# Patient Record
Sex: Female | Born: 1950 | Race: White | Hispanic: No | Marital: Married | Smoking: Never smoker
Health system: Southern US, Community
[De-identification: ages and names within clinical notes are randomized; demographics above are authoritative.]

## PROBLEM LIST (undated history)

## (undated) DIAGNOSIS — M722 Plantar fascial fibromatosis: Secondary | ICD-10-CM

## (undated) DIAGNOSIS — I10 Essential (primary) hypertension: Secondary | ICD-10-CM

## (undated) HISTORY — PX: KNEE ARTHROSCOPY: SUR90

## (undated) HISTORY — PX: TONSILLECTOMY: SUR1361

## (undated) HISTORY — PX: APPENDECTOMY: SHX54

---

## 2003-12-18 ENCOUNTER — Ambulatory Visit: Payer: Self-pay | Admitting: Gastroenterology

## 2004-12-06 ENCOUNTER — Ambulatory Visit: Payer: Self-pay | Admitting: Obstetrics and Gynecology

## 2005-12-12 ENCOUNTER — Ambulatory Visit: Payer: Self-pay | Admitting: Family Medicine

## 2006-10-30 ENCOUNTER — Ambulatory Visit: Payer: Self-pay | Admitting: Orthopaedic Surgery

## 2006-12-19 ENCOUNTER — Ambulatory Visit: Payer: Self-pay | Admitting: Anesthesiology

## 2007-01-01 ENCOUNTER — Ambulatory Visit: Payer: Self-pay | Admitting: Anesthesiology

## 2007-02-04 ENCOUNTER — Ambulatory Visit: Payer: Self-pay | Admitting: Anesthesiology

## 2007-02-05 ENCOUNTER — Ambulatory Visit: Payer: Self-pay | Admitting: Family Medicine

## 2007-03-25 ENCOUNTER — Ambulatory Visit: Payer: Self-pay | Admitting: Anesthesiology

## 2007-05-06 ENCOUNTER — Ambulatory Visit: Payer: Self-pay | Admitting: Anesthesiology

## 2007-05-18 ENCOUNTER — Emergency Department: Payer: Self-pay | Admitting: Emergency Medicine

## 2007-06-04 ENCOUNTER — Ambulatory Visit: Payer: Self-pay | Admitting: Anesthesiology

## 2007-07-08 ENCOUNTER — Ambulatory Visit: Payer: Self-pay | Admitting: Anesthesiology

## 2007-07-24 ENCOUNTER — Ambulatory Visit: Payer: Self-pay | Admitting: Anesthesiology

## 2007-08-23 ENCOUNTER — Ambulatory Visit: Payer: Self-pay | Admitting: Anesthesiology

## 2007-09-19 ENCOUNTER — Ambulatory Visit: Payer: Self-pay | Admitting: Anesthesiology

## 2007-10-02 ENCOUNTER — Encounter: Payer: Self-pay | Admitting: Anesthesiology

## 2007-10-12 ENCOUNTER — Encounter: Payer: Self-pay | Admitting: Anesthesiology

## 2007-10-14 ENCOUNTER — Ambulatory Visit: Payer: Self-pay | Admitting: Anesthesiology

## 2007-11-12 ENCOUNTER — Encounter: Payer: Self-pay | Admitting: Anesthesiology

## 2007-12-19 ENCOUNTER — Ambulatory Visit: Payer: Self-pay | Admitting: Anesthesiology

## 2008-02-20 ENCOUNTER — Ambulatory Visit: Payer: Self-pay | Admitting: Family Medicine

## 2008-02-20 ENCOUNTER — Ambulatory Visit: Payer: Self-pay | Admitting: Orthopedic Surgery

## 2008-03-09 ENCOUNTER — Ambulatory Visit: Payer: Self-pay | Admitting: Orthopedic Surgery

## 2008-03-10 ENCOUNTER — Ambulatory Visit: Payer: Self-pay | Admitting: Orthopedic Surgery

## 2009-06-17 ENCOUNTER — Ambulatory Visit: Payer: Self-pay | Admitting: Family Medicine

## 2010-07-15 ENCOUNTER — Ambulatory Visit: Payer: Self-pay | Admitting: Family Medicine

## 2011-07-18 ENCOUNTER — Ambulatory Visit: Payer: Self-pay | Admitting: Family Medicine

## 2011-07-21 ENCOUNTER — Ambulatory Visit: Payer: Self-pay | Admitting: Family Medicine

## 2012-01-20 ENCOUNTER — Ambulatory Visit: Payer: Self-pay | Admitting: Orthopedic Surgery

## 2012-08-28 ENCOUNTER — Ambulatory Visit: Payer: Self-pay | Admitting: Family Medicine

## 2013-09-09 ENCOUNTER — Ambulatory Visit: Payer: Self-pay | Admitting: Family Medicine

## 2014-01-26 ENCOUNTER — Ambulatory Visit: Payer: Self-pay | Admitting: Gastroenterology

## 2014-06-29 ENCOUNTER — Encounter: Payer: Self-pay | Admitting: Podiatry

## 2014-06-29 ENCOUNTER — Ambulatory Visit (INDEPENDENT_AMBULATORY_CARE_PROVIDER_SITE_OTHER): Payer: Federal, State, Local not specified - PPO | Admitting: Podiatry

## 2014-06-29 ENCOUNTER — Ambulatory Visit (INDEPENDENT_AMBULATORY_CARE_PROVIDER_SITE_OTHER): Payer: Federal, State, Local not specified - PPO

## 2014-06-29 VITALS — BP 113/58 | HR 63 | Resp 16 | Ht 66.0 in | Wt 185.0 lb

## 2014-06-29 DIAGNOSIS — M722 Plantar fascial fibromatosis: Secondary | ICD-10-CM

## 2014-06-29 DIAGNOSIS — M79673 Pain in unspecified foot: Secondary | ICD-10-CM | POA: Diagnosis not present

## 2014-06-29 DIAGNOSIS — M779 Enthesopathy, unspecified: Secondary | ICD-10-CM

## 2014-06-29 MED ORDER — MELOXICAM 15 MG PO TABS: 15.0000 mg | ORAL_TABLET | Freq: Every day | ORAL | Status: DC

## 2014-06-29 MED ORDER — METHYLPREDNISOLONE 4 MG PO TBPK: ORAL_TABLET | ORAL | Status: DC

## 2014-06-29 NOTE — Progress Notes (Signed)
   Subjective:    Patient ID: Jaime Hawkins, female    DOB: May 02, 1950, 64 y.o.   MRN: [id number]  HPI Comments: "I have pain in my feet"  Patient c/o burning, aching, cramping plantar forefoot and toes bilateral for about 2 years. She did see a doc and prescribe orthotics-made symptoms worse. She tried taking B12 and helped some. She gets some numbness occasionally.      Review of Systems  Musculoskeletal: Positive for back pain and arthralgias.  All other systems reviewed and are negative.      Objective:   Physical Exam: I have reviewed her past medical history medications out a surgery social history and review of systems. Pulses are strongly palpable bilateral. Neurologic sensorium is intact per Semmes-Weinstein monofilament. Deep tendon reflexes are intact bilateral muscle strength +5 over 5 dorsiflexion plantar flexors and inverters everters all intrinsic musculature is the same. Orthopedic evaluation demonstrates pain on palpation second metatarsophalangeal joint and third metatarsophalangeal joint bilaterally. She also has pain on palpation medial calcaneal tubercles bilateral. Cutaneous evaluation of a straight supple well-hydrated cutis no erythema adenosine measures or odor.          Assessment & Plan:  Assessment: Plantar fasciitis with capsulitis bilateral.  Plan: Discussed etiology pathology conservative versus surgical therapies. Injected the bilateral heels today as well as the capsulitis bilaterally second metatarsophalangeal joint. This is all performed after sterile Betadine skin prep have been applied. Both oral and written home going instructions were applied and were were provided to the patient she also received a fascial braces and a night splint. As well as a prescription for Medrol Dosepak to be followed by meloxicam.

## 2014-07-03 ENCOUNTER — Ambulatory Visit: Payer: Federal, State, Local not specified - PPO

## 2014-07-22 ENCOUNTER — Ambulatory Visit (INDEPENDENT_AMBULATORY_CARE_PROVIDER_SITE_OTHER): Payer: Federal, State, Local not specified - PPO | Admitting: Podiatry

## 2014-07-22 ENCOUNTER — Encounter: Payer: Self-pay | Admitting: Podiatry

## 2014-07-22 VITALS — BP 125/69 | HR 58 | Resp 16

## 2014-07-22 DIAGNOSIS — M722 Plantar fascial fibromatosis: Secondary | ICD-10-CM

## 2014-07-22 DIAGNOSIS — L600 Ingrowing nail: Secondary | ICD-10-CM

## 2014-07-22 DIAGNOSIS — M779 Enthesopathy, unspecified: Secondary | ICD-10-CM | POA: Diagnosis not present

## 2014-07-22 MED ORDER — NEOMYCIN-POLYMYXIN-HC 1 % OT SOLN: OTIC | Status: DC

## 2014-07-22 NOTE — Patient Instructions (Signed)

## 2014-07-23 NOTE — Progress Notes (Signed)
She presents today with chief complaint of a hallux nail right foot that is extremely painful. We had discussed at our last visit about removing the nail because of the onychocryptosis that is present. 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 She states that the plantar fasciitis is doing much better. She denies changes in her past medical history medications allergies and social history.  Objective: Vital signs are stable alert and oriented 3. Pulses are palpable bilateral. Neurologic sensorium is intact bilateral. Sharp incurvated nail margins to the tibial fibular border of the hallux right with the distal aspect of the nail plate floating above the nailbed. This nail was exquisitely tender on palpation. She has minimal tenderness on palpation of the medial calcaneal tubercle.  Assessment: Resolving plantar fasciitis. Painful ingrown nail hallux right.  Plan: Discussed etiology pathology conservative versus surgical therapies. At this point I suggested that we perform a total chemical matrixectomy to the hallux right. She understood this and was amendable to it. We performed this total matricectomy today after thorough discussion and localization of the toe. She tolerated the nail avulsion and matrixectomy very well as 3 applications of phenol were applied. Isopropyl alcohol was utilized to neutralize the alcohol. A sterile compressive dressing was applied bilaterally. She was given both oral and written home going instructions for the care and soaking of this toe as well as prescription for Cortisporin Otic which will be applied twice daily after soaking. I will follow up with her in 1 week

## 2014-07-29 ENCOUNTER — Ambulatory Visit (INDEPENDENT_AMBULATORY_CARE_PROVIDER_SITE_OTHER): Payer: Federal, State, Local not specified - PPO | Admitting: Podiatry

## 2014-07-29 VITALS — BP 112/68 | HR 60 | Resp 16

## 2014-07-29 DIAGNOSIS — L600 Ingrowing nail: Secondary | ICD-10-CM

## 2014-07-29 NOTE — Progress Notes (Signed)
She presents today for follow-up of her plantar fasciitis and her more recently performed nail avulsion matrixectomy hallux right. She states that she continues to soak in Betadine and water and apply Cortisporin otic as directed. She states that her plantar fasciitis seems to be improving and she continues her night splint plantar fascial braces as well as her anti-inflammatories.  Objective: Vital signs are stable she is alert and oriented 3. Hallux demonstrates a well-healing matrixectomy right granulation tissue is present and epithelialization is occurring. She has minimal pain on palpation medial calcaneal tubercles bilateral.  Assessment: Well-healing matrixectomy hallux right. Plantar fasciitis resolving bilateral.  Plan: Continue all conservative therapies regarding the plantar fasciitis. Discontinue the use of the Betadine and water for the hallux right however we are start Epsom salts and warm water twice daily apply Cortisporin otic as directed and covered during the daytime. She will leave it open at night. I will follow-up with her in 1 month. She will call sooner if she is concerned about infection R regression of her fasciitis.

## 2014-07-30 ENCOUNTER — Other Ambulatory Visit: Payer: Self-pay | Admitting: Family Medicine

## 2014-07-30 DIAGNOSIS — Z0000 Encounter for general adult medical examination without abnormal findings: Secondary | ICD-10-CM

## 2014-08-03 ENCOUNTER — Ambulatory Visit: Payer: Federal, State, Local not specified - PPO | Admitting: Podiatry

## 2014-08-26 ENCOUNTER — Ambulatory Visit: Payer: Federal, State, Local not specified - PPO | Admitting: Podiatry

## 2014-09-07 ENCOUNTER — Ambulatory Visit (INDEPENDENT_AMBULATORY_CARE_PROVIDER_SITE_OTHER): Payer: Federal, State, Local not specified - PPO | Admitting: Podiatry

## 2014-09-07 DIAGNOSIS — M722 Plantar fascial fibromatosis: Secondary | ICD-10-CM | POA: Diagnosis not present

## 2014-09-07 DIAGNOSIS — L03031 Cellulitis of right toe: Secondary | ICD-10-CM | POA: Diagnosis not present

## 2014-09-07 MED ORDER — SULFAMETHOXAZOLE-TRIMETHOPRIM 800-160 MG PO TABS: ORAL_TABLET | ORAL | Status: DC

## 2014-09-07 NOTE — Progress Notes (Signed)
She states that her plantar fasciitis is doing much better however the nail avulsion hallux right was doing better until her daughter's dog stepped on it. She states that it started bleeding and was painful once again. She denies fever chills nausea vomiting muscle aches and pains.  Objective: Vital signs are stable alert and oriented 3. Pulses are palpable. She has pain on palpation medial calcaneal tubercle but to a much less degree. She also has paronychia with granulation tissue hallux right.  Assessment: Paronychia hallux right. Plantar fasciitis right.  Plan: Started her on Bactrim DS 2 tablets twice daily and soaks. Discussed the possible need for orthotics. I will follow up with her in 2-3 weeks. We will reevaluate plantar fasciitis at that time.

## 2014-09-15 ENCOUNTER — Telehealth: Payer: Self-pay | Admitting: *Deleted

## 2014-09-15 ENCOUNTER — Ambulatory Visit: Payer: Self-pay

## 2014-09-15 MED ORDER — AMOXICILLIN-POT CLAVULANATE 875-125 MG PO TABS: 1.0000 | ORAL_TABLET | Freq: Two times a day (BID) | ORAL | Status: DC

## 2014-09-15 NOTE — Telephone Encounter (Signed)
Discontinue septra and start augmentin 875 one by mouth twice daily for 10 days.

## 2014-09-15 NOTE — Telephone Encounter (Addendum)
Pt states she is having trouble with her antibiotics.  Pt states since she began the antibiotic, her stomach has burned up high and she's been nauseous, with a headache.  I told pt to stop the Septra and we would call back with instructions.  Dr. Milinda Pointer ordered Augmentin and pt was called with instructions and encouraged to call with concerns.

## 2014-10-05 ENCOUNTER — Encounter: Payer: Self-pay | Admitting: Podiatry

## 2014-10-05 ENCOUNTER — Ambulatory Visit (INDEPENDENT_AMBULATORY_CARE_PROVIDER_SITE_OTHER): Payer: Federal, State, Local not specified - PPO | Admitting: Podiatry

## 2014-10-05 VITALS — BP 138/75 | HR 60 | Resp 16

## 2014-10-05 DIAGNOSIS — L03031 Cellulitis of right toe: Secondary | ICD-10-CM | POA: Diagnosis not present

## 2014-10-05 DIAGNOSIS — M722 Plantar fascial fibromatosis: Secondary | ICD-10-CM

## 2014-10-05 NOTE — Progress Notes (Signed)
She presents today for follow-up of her plantar fasciitis bilateral. She states that they're starting to do much better. She is also following up for her nail avulsion matrixectomy hallux right. She states it is been longtime coming but it seems to be doing much better.  Objective: Vital signs are stable she is alert and oriented 3. Pulses are strongly palpable bilateral pain. Hallux does demonstrate a total nail avulsion with matrixectomy which demonstrates well-healing matrixectomy. No erythema edema saline as drainage or odor. The toe is mildly hypersensitive to touch. She also has tenderness on palpation medial calcaneal tubercles bilateral.  Assessment: Paresthesias hallux right status post matrixectomy. Plantar fasciitis bilateral resolving.  Plan: Suggested contrast baths for the hallux right. Injected the bilateral heels today with Kenalog and local and aesthetic. Follow up with her in 1 month.

## 2014-10-08 ENCOUNTER — Ambulatory Visit
Admission: RE | Admit: 2014-10-08 | Discharge: 2014-10-08 | Disposition: A | Discharge status: Home / Self Care | Payer: Federal, State, Local not specified - PPO | Source: Ambulatory Visit | Attending: Family Medicine | Admitting: Family Medicine

## 2014-10-08 DIAGNOSIS — Z1231 Encounter for screening mammogram for malignant neoplasm of breast: Secondary | ICD-10-CM | POA: Diagnosis not present

## 2014-10-08 DIAGNOSIS — Z0000 Encounter for general adult medical examination without abnormal findings: Secondary | ICD-10-CM

## 2014-11-09 ENCOUNTER — Ambulatory Visit: Payer: Federal, State, Local not specified - PPO | Admitting: Podiatry

## 2015-08-16 ENCOUNTER — Encounter: Payer: Self-pay | Admitting: Podiatry

## 2015-08-16 ENCOUNTER — Ambulatory Visit (INDEPENDENT_AMBULATORY_CARE_PROVIDER_SITE_OTHER): Payer: Federal, State, Local not specified - PPO | Admitting: Podiatry

## 2015-08-16 DIAGNOSIS — M722 Plantar fascial fibromatosis: Secondary | ICD-10-CM

## 2015-08-16 DIAGNOSIS — I10 Essential (primary) hypertension: Secondary | ICD-10-CM | POA: Insufficient documentation

## 2015-08-16 DIAGNOSIS — G629 Polyneuropathy, unspecified: Secondary | ICD-10-CM | POA: Diagnosis not present

## 2015-08-16 DIAGNOSIS — M1990 Unspecified osteoarthritis, unspecified site: Secondary | ICD-10-CM | POA: Insufficient documentation

## 2015-08-16 NOTE — Progress Notes (Signed)
She presents today after having not seen her for almost a year with a chief complaint of painful heels bilaterally. She is also complaining of numbness and pain in her toes radiating from her medial longitudinal arch distally. She states that this is been going on for more than a year.  Objective: Vital signs are stable alert and oriented 3. Pulses are palpable. Neurologic sensorium is intact. She has pain on palpation medial calcaneal tubercles bilateral.  Assessment: Plantar fasciitis bilateral. Cannot rule out neuropathy.  Plan: I will follow-up with her in 1 month at which time we will perform a nerve biopsy. I also injected her today bilaterally to the point of maximal tenderness at the bilateral heel with Kenalog and local anesthetic.

## 2015-09-15 ENCOUNTER — Ambulatory Visit (INDEPENDENT_AMBULATORY_CARE_PROVIDER_SITE_OTHER): Payer: Federal, State, Local not specified - PPO | Admitting: Podiatry

## 2015-09-15 ENCOUNTER — Encounter: Payer: Self-pay | Admitting: Podiatry

## 2015-09-15 DIAGNOSIS — M722 Plantar fascial fibromatosis: Secondary | ICD-10-CM | POA: Diagnosis not present

## 2015-09-16 NOTE — Progress Notes (Signed)
She presents today for follow-up of bilateral plantar fasciitis and neuropathy. She states that she does not want to do the dermal-epidermal nerve fiber test today.  Objective: Vital signs are stable she's alert and oriented 3 pulses are palpable. Pain to bilateral heels is reducing in nature.  Assessment: Plantar fasciitis bilateral. With neuropathy.  Plan: Injection of bilateral heels and I will follow-up with her as needed.

## 2015-10-12 ENCOUNTER — Other Ambulatory Visit: Payer: Self-pay | Admitting: Family Medicine

## 2015-10-12 DIAGNOSIS — Z1231 Encounter for screening mammogram for malignant neoplasm of breast: Secondary | ICD-10-CM

## 2015-10-22 ENCOUNTER — Other Ambulatory Visit: Payer: Self-pay | Admitting: Family Medicine

## 2015-10-22 ENCOUNTER — Ambulatory Visit
Admission: RE | Admit: 2015-10-22 | Discharge: 2015-10-22 | Disposition: A | Discharge status: Home / Self Care | Payer: Federal, State, Local not specified - PPO | Source: Ambulatory Visit | Attending: Family Medicine | Admitting: Family Medicine

## 2015-10-22 DIAGNOSIS — Z1231 Encounter for screening mammogram for malignant neoplasm of breast: Secondary | ICD-10-CM

## 2015-10-25 ENCOUNTER — Other Ambulatory Visit: Payer: Self-pay | Admitting: Family Medicine

## 2015-10-25 DIAGNOSIS — N6489 Other specified disorders of breast: Secondary | ICD-10-CM

## 2015-10-27 ENCOUNTER — Ambulatory Visit
Admission: RE | Admit: 2015-10-27 | Discharge: 2015-10-27 | Disposition: A | Discharge status: Home / Self Care | Payer: Federal, State, Local not specified - PPO | Source: Ambulatory Visit | Attending: Family Medicine | Admitting: Family Medicine

## 2015-10-27 DIAGNOSIS — N6489 Other specified disorders of breast: Secondary | ICD-10-CM | POA: Insufficient documentation

## 2016-04-24 ENCOUNTER — Other Ambulatory Visit: Payer: Self-pay | Admitting: Orthopedic Surgery

## 2016-04-24 DIAGNOSIS — G8929 Other chronic pain: Secondary | ICD-10-CM

## 2016-04-24 DIAGNOSIS — M5442 Lumbago with sciatica, left side: Principal | ICD-10-CM

## 2016-05-02 ENCOUNTER — Ambulatory Visit
Admission: RE | Admit: 2016-05-02 | Discharge: 2016-05-02 | Disposition: A | Discharge status: Home / Self Care | Payer: Federal, State, Local not specified - PPO | Source: Ambulatory Visit | Attending: Orthopedic Surgery | Admitting: Orthopedic Surgery

## 2016-05-02 DIAGNOSIS — G8929 Other chronic pain: Secondary | ICD-10-CM | POA: Insufficient documentation

## 2016-05-02 DIAGNOSIS — M47817 Spondylosis without myelopathy or radiculopathy, lumbosacral region: Secondary | ICD-10-CM | POA: Insufficient documentation

## 2016-05-02 DIAGNOSIS — M5442 Lumbago with sciatica, left side: Secondary | ICD-10-CM | POA: Diagnosis present

## 2016-05-02 DIAGNOSIS — M5136 Other intervertebral disc degeneration, lumbar region: Secondary | ICD-10-CM | POA: Insufficient documentation

## 2016-08-30 ENCOUNTER — Other Ambulatory Visit: Payer: Self-pay | Admitting: Orthopedic Surgery

## 2016-08-30 DIAGNOSIS — M170 Bilateral primary osteoarthritis of knee: Secondary | ICD-10-CM

## 2016-09-04 ENCOUNTER — Ambulatory Visit: Payer: Federal, State, Local not specified - PPO

## 2016-09-06 ENCOUNTER — Ambulatory Visit
Admission: RE | Admit: 2016-09-06 | Discharge: 2016-09-06 | Disposition: A | Discharge status: Home / Self Care | Payer: Federal, State, Local not specified - PPO | Source: Ambulatory Visit | Attending: Orthopedic Surgery | Admitting: Orthopedic Surgery

## 2016-09-06 DIAGNOSIS — M170 Bilateral primary osteoarthritis of knee: Secondary | ICD-10-CM | POA: Diagnosis present

## 2016-09-06 DIAGNOSIS — M1711 Unilateral primary osteoarthritis, right knee: Secondary | ICD-10-CM | POA: Diagnosis not present

## 2016-09-22 ENCOUNTER — Other Ambulatory Visit: Payer: Self-pay | Admitting: Family Medicine

## 2016-09-22 DIAGNOSIS — Z1231 Encounter for screening mammogram for malignant neoplasm of breast: Secondary | ICD-10-CM

## 2016-10-11 ENCOUNTER — Encounter
Admission: RE | Admit: 2016-10-11 | Discharge: 2016-10-11 | Disposition: A | Discharge status: Home / Self Care | Payer: Federal, State, Local not specified - PPO | Source: Ambulatory Visit | Attending: Orthopedic Surgery | Admitting: Orthopedic Surgery

## 2016-10-11 DIAGNOSIS — Z01818 Encounter for other preprocedural examination: Secondary | ICD-10-CM | POA: Insufficient documentation

## 2016-10-11 DIAGNOSIS — M1711 Unilateral primary osteoarthritis, right knee: Secondary | ICD-10-CM | POA: Diagnosis not present

## 2016-10-11 HISTORY — DX: Essential (primary) hypertension: I10

## 2016-10-11 HISTORY — DX: Plantar fascial fibromatosis: M72.2

## 2016-10-11 LAB — URINALYSIS, COMPLETE (UACMP) WITH MICROSCOPIC
BILIRUBIN URINE: NEGATIVE
Bacteria, UA: NONE SEEN
Glucose, UA: NEGATIVE mg/dL
Hgb urine dipstick: NEGATIVE
Ketones, ur: NEGATIVE mg/dL
Leukocytes, UA: NEGATIVE
Nitrite: NEGATIVE
Protein, ur: NEGATIVE mg/dL
Specific Gravity, Urine: 1.008 (ref 1.005–1.030)
WBC UA: NONE SEEN WBC/hpf (ref 0–5)
pH: 6 (ref 5.0–8.0)

## 2016-10-11 LAB — PROTIME-INR
INR: 0.97
Prothrombin Time: 12.9 seconds (ref 11.4–15.2)

## 2016-10-11 LAB — CBC
HCT: 42.9 % (ref 35.0–47.0)
HEMOGLOBIN: 14.5 g/dL (ref 12.0–16.0)
MCH: 30.7 pg (ref 26.0–34.0)
MCHC: 33.8 g/dL (ref 32.0–36.0)
MCV: 90.7 fL (ref 80.0–100.0)
PLATELETS: 240 10*3/uL (ref 150–440)
RBC: 4.73 MIL/uL (ref 3.80–5.20)
RDW: 13.3 % (ref 11.5–14.5)
WBC: 5.5 10*3/uL (ref 3.6–11.0)

## 2016-10-11 LAB — TYPE AND SCREEN
ABO/RH(D): O POS
ANTIBODY SCREEN: NEGATIVE

## 2016-10-11 LAB — SURGICAL PCR SCREEN
MRSA, PCR: POSITIVE — AB
STAPHYLOCOCCUS AUREUS: POSITIVE — AB

## 2016-10-11 LAB — APTT: aPTT: 36 seconds (ref 24–36)

## 2016-10-11 LAB — SEDIMENTATION RATE: Sed Rate: 9 mm/hr (ref 0–30)

## 2016-10-11 NOTE — Pre-Procedure Instructions (Addendum)
Requested an EKG from Dr Hedrick's office.  EKG shows a sinus rhythm with a rate of 63, PR 0.14, QRS 0.08, QT 0.42, Axis 53, normal EKG

## 2016-10-11 NOTE — Patient Instructions (Addendum)
Your procedure is scheduled on: 10/17/16 Tues Report to Same Day Surgery 2nd floor medical mall [facility] Entrance-take elevator on left to 2nd floor.  Check in with surgery information desk.) To find out your arrival time please call [phone] between 1PM - 3PM on 10/16/16 Mon  Remember: Instructions that are not followed completely may result in serious medical risk, up to and including death, or upon the discretion of your surgeon and anesthesiologist your surgery may need to be rescheduled.    _x___ 1. Do not eat food or drink liquids after midnight. No gum chewing or                              hard candies.     __x__ 2. No Alcohol for 24 hours before or after surgery.   __x__3. No Smoking for 24 prior to surgery.   ____  4. Bring all medications with you on the day of surgery if instructed.    __x__ 5. Notify your doctor if there is any change in your medical condition     (cold, fever, infections).     Do not wear jewelry, make-up, hairpins, clips or nail polish.  Do not wear lotions, powders, or perfumes. You may wear deodorant.  Do not shave 48 hours prior to surgery. Men may shave face and neck.  Do not bring valuables to the hospital.    [facility] is not responsible for any belongings or valuables.               Contacts, dentures or bridgework may not be worn into surgery.  Leave your suitcase in the car. After surgery it may be brought to your room.  For patients admitted to the hospital, discharge time is determined by your                       treatment team.   Patients discharged the day of surgery will not be allowed to drive home.  You will need someone to drive you home and stay with you the night of your procedure.    Please read over the following fact sheets that you were given:   [facility] Preparing for Surgery and or MRSA Information   _x___ Take anti-hypertensive (unless it includes a diuretic), cardiac, seizure, asthma,     anti-reflux and  psychiatric medicines. These include:  1. olmesartan (BENICAR) 40  The night before surgery  2.  3.  4.  5.  6.  ____Fleets enema or Magnesium Citrate as directed.   _x___ Use CHG Soap or sage wipes as directed on instruction sheet   ____ Use inhalers on the day of surgery and bring to hospital day of surgery  ____ Stop Metformin and Janumet 2 days prior to surgery.    ____ Take 1/2 of usual insulin dose the night before surgery and none on the morning     surgery.   _x___ Follow recommendations from Cardiologist, Pulmonologist or PCP regarding          stopping Aspirin, Coumadin, Pllavix ,Eliquis, Effient, or Pradaxa, and Pletal.  X____Stop Anti-inflammatories such as Advil, Aleve, Ibuprofen, Motrin, Naproxen, Naprosyn, Goodies powders or aspirin products. Stop Mobic today.  OK to take Tylenol and                          Celebrex.   _x___ Stop supplements  until after surgery.  But may continue Vitamin D, Vitamin B,       and multivitamin.   ____ Bring C-Pap to the hospital.

## 2016-10-11 NOTE — Pre-Procedure Instructions (Addendum)
States "I am sensitive to narcotics."  "I slept for 13 hours from a single dose of zyrtec."

## 2016-10-12 LAB — URINE CULTURE: CULTURE: NO GROWTH

## 2016-10-16 MED ORDER — CEFAZOLIN SODIUM-DEXTROSE 2-4 GM/100ML-% IV SOLN
2.0000 g | Freq: Once | INTRAVENOUS | Status: AC
  Administered 2016-10-17: 2 g via INTRAVENOUS

## 2016-10-16 MED ORDER — TRANEXAMIC ACID 1000 MG/10ML IV SOLN
1000.0000 mg | INTRAVENOUS | Status: AC
  Administered 2016-10-17: 1000 mg via INTRAVENOUS
  Filled 2016-10-16: qty 10

## 2016-10-16 MED ORDER — VANCOMYCIN HCL IN DEXTROSE 1-5 GM/200ML-% IV SOLN
1000.0000 mg | Freq: Once | INTRAVENOUS | Status: AC
  Administered 2016-10-17: 1000 mg via INTRAVENOUS

## 2016-10-17 ENCOUNTER — Encounter
Admission: RE | Disposition: A | Discharge status: Home Health | Payer: Self-pay | Source: Ambulatory Visit | Attending: Orthopedic Surgery

## 2016-10-17 ENCOUNTER — Inpatient Hospital Stay: Payer: Federal, State, Local not specified - PPO | Admitting: Registered Nurse

## 2016-10-17 ENCOUNTER — Inpatient Hospital Stay
Admission: RE | Admit: 2016-10-17 | Discharge: 2016-10-21 | DRG: 470 | Disposition: A | Discharge status: Home Health | Payer: Federal, State, Local not specified - PPO | Source: Ambulatory Visit | Attending: Orthopedic Surgery | Admitting: Orthopedic Surgery

## 2016-10-17 ENCOUNTER — Inpatient Hospital Stay: Payer: Federal, State, Local not specified - PPO

## 2016-10-17 DIAGNOSIS — I959 Hypotension, unspecified: Secondary | ICD-10-CM | POA: Diagnosis not present

## 2016-10-17 DIAGNOSIS — G8918 Other acute postprocedural pain: Secondary | ICD-10-CM

## 2016-10-17 DIAGNOSIS — Z79899 Other long term (current) drug therapy: Secondary | ICD-10-CM | POA: Diagnosis not present

## 2016-10-17 DIAGNOSIS — I10 Essential (primary) hypertension: Secondary | ICD-10-CM | POA: Diagnosis present

## 2016-10-17 DIAGNOSIS — Z881 Allergy status to other antibiotic agents status: Secondary | ICD-10-CM

## 2016-10-17 DIAGNOSIS — E876 Hypokalemia: Secondary | ICD-10-CM | POA: Diagnosis not present

## 2016-10-17 DIAGNOSIS — Z791 Long term (current) use of non-steroidal anti-inflammatories (NSAID): Secondary | ICD-10-CM | POA: Diagnosis not present

## 2016-10-17 DIAGNOSIS — M170 Bilateral primary osteoarthritis of knee: Principal | ICD-10-CM | POA: Diagnosis present

## 2016-10-17 DIAGNOSIS — M1711 Unilateral primary osteoarthritis, right knee: Secondary | ICD-10-CM | POA: Diagnosis present

## 2016-10-17 DIAGNOSIS — Z885 Allergy status to narcotic agent status: Secondary | ICD-10-CM

## 2016-10-17 DIAGNOSIS — Z882 Allergy status to sulfonamides status: Secondary | ICD-10-CM

## 2016-10-17 HISTORY — PX: TOTAL KNEE ARTHROPLASTY: SHX125

## 2016-10-17 LAB — CBC
HEMATOCRIT: 35.3 % (ref 35.0–47.0)
Hemoglobin: 12.1 g/dL (ref 12.0–16.0)
MCH: 30.7 pg (ref 26.0–34.0)
MCHC: 34.3 g/dL (ref 32.0–36.0)
MCV: 89.5 fL (ref 80.0–100.0)
PLATELETS: 213 10*3/uL (ref 150–440)
RBC: 3.94 MIL/uL (ref 3.80–5.20)
RDW: 13 % (ref 11.5–14.5)
WBC: 13.6 10*3/uL — AB (ref 3.6–11.0)

## 2016-10-17 LAB — ABO/RH: ABO/RH(D): O POS

## 2016-10-17 LAB — CREATININE, SERUM
CREATININE: 0.93 mg/dL (ref 0.44–1.00)
GFR calc non Af Amer: >60 mL/min (ref >60)

## 2016-10-17 LAB — GLUCOSE, CAPILLARY: GLUCOSE-CAPILLARY: 96 mg/dL (ref 65–99)

## 2016-10-17 SURGERY — ARTHROPLASTY, KNEE, TOTAL
Anesthesia: Spinal | Site: Knee | Laterality: Right | Wound class: Clean

## 2016-10-17 MED ORDER — VITAMIN D (ERGOCALCIFEROL) 1.25 MG (50000 UNIT) PO CAPS
50000.0000 [IU] | ORAL_CAPSULE | ORAL | Status: DC
  Administered 2016-10-20: 50000 [IU] via ORAL
  Filled 2016-10-17: qty 1

## 2016-10-17 MED ORDER — BUPIVACAINE LIPOSOME 1.3 % IJ SUSP
INTRAMUSCULAR | Status: AC
  Filled 2016-10-17: qty 20

## 2016-10-17 MED ORDER — NALOXONE HCL 0.4 MG/ML IJ SOLN
INTRAMUSCULAR | Status: AC
  Filled 2016-10-17: qty 1

## 2016-10-17 MED ORDER — BUPIVACAINE-EPINEPHRINE (PF) 0.25% -1:200000 IJ SOLN
INTRAMUSCULAR | Status: DC | PRN
  Administered 2016-10-17: 30 mL

## 2016-10-17 MED ORDER — SODIUM CHLORIDE 0.9 % IV SOLN
INTRAVENOUS | Status: DC | PRN
  Administered 2016-10-17: 60 mL

## 2016-10-17 MED ORDER — PHENYLEPHRINE HCL 10 MG/ML IJ SOLN
INTRAMUSCULAR | Status: DC | PRN
  Administered 2016-10-17: 20 ug/min via INTRAVENOUS

## 2016-10-17 MED ORDER — EPHEDRINE SULFATE 50 MG/ML IJ SOLN
INTRAMUSCULAR | Status: DC | PRN
  Administered 2016-10-17 (×3): 5 mg via INTRAVENOUS

## 2016-10-17 MED ORDER — MORPHINE SULFATE 10 MG/ML IJ SOLN
INTRAMUSCULAR | Status: DC | PRN
  Administered 2016-10-17: 10 mg

## 2016-10-17 MED ORDER — TRIAMTERENE-HCTZ 37.5-25 MG PO TABS
1.0000 | ORAL_TABLET | Freq: Every day | ORAL | Status: DC
  Administered 2016-10-18 – 2016-10-19 (×2): 1 via ORAL
  Filled 2016-10-17 (×5): qty 1

## 2016-10-17 MED ORDER — METHOCARBAMOL 500 MG PO TABS
500.0000 mg | ORAL_TABLET | Freq: Four times a day (QID) | ORAL | Status: DC | PRN
  Administered 2016-10-18: 250 mg via ORAL
  Administered 2016-10-19 – 2016-10-20 (×3): 500 mg via ORAL
  Filled 2016-10-17 (×4): qty 1

## 2016-10-17 MED ORDER — KETOROLAC TROMETHAMINE 30 MG/ML IJ SOLN
INTRAMUSCULAR | Status: AC
  Filled 2016-10-17: qty 1

## 2016-10-17 MED ORDER — FENTANYL CITRATE (PF) 100 MCG/2ML IJ SOLN
INTRAMUSCULAR | Status: AC
  Filled 2016-10-17: qty 2

## 2016-10-17 MED ORDER — PROPOFOL 500 MG/50ML IV EMUL
INTRAVENOUS | Status: DC | PRN
  Administered 2016-10-17: 100 ug/kg/min via INTRAVENOUS

## 2016-10-17 MED ORDER — CEFAZOLIN SODIUM-DEXTROSE 2-4 GM/100ML-% IV SOLN
2.0000 g | Freq: Four times a day (QID) | INTRAVENOUS | Status: AC
  Administered 2016-10-17 – 2016-10-18 (×2): 2 g via INTRAVENOUS
  Filled 2016-10-17 (×2): qty 100

## 2016-10-17 MED ORDER — TRAMADOL HCL 50 MG PO TABS
50.0000 mg | ORAL_TABLET | Freq: Four times a day (QID) | ORAL | Status: DC | PRN
  Filled 2016-10-17: qty 1

## 2016-10-17 MED ORDER — LIDOCAINE HCL (CARDIAC) 20 MG/ML IV SOLN
INTRAVENOUS | Status: DC | PRN
Start: 2016-10-17 — End: 2016-10-17
  Administered 2016-10-17: 40 mg via INTRAVENOUS

## 2016-10-17 MED ORDER — PROPOFOL 500 MG/50ML IV EMUL
INTRAVENOUS | Status: AC
  Filled 2016-10-17: qty 50

## 2016-10-17 MED ORDER — PHENYLEPHRINE HCL 10 MG/ML IJ SOLN: INTRAMUSCULAR | Status: DC | PRN

## 2016-10-17 MED ORDER — DIPHENHYDRAMINE HCL 12.5 MG/5ML PO ELIX: 12.5000 mg | ORAL_SOLUTION | ORAL | Status: DC | PRN

## 2016-10-17 MED ORDER — FENTANYL CITRATE (PF) 100 MCG/2ML IJ SOLN: 25.0000 ug | INTRAMUSCULAR | Status: DC | PRN

## 2016-10-17 MED ORDER — ONDANSETRON HCL 4 MG/2ML IJ SOLN: 4.0000 mg | Freq: Once | INTRAMUSCULAR | Status: DC | PRN

## 2016-10-17 MED ORDER — LACTATED RINGERS IV SOLN
INTRAVENOUS | Status: DC
  Administered 2016-10-17 (×2): via INTRAVENOUS

## 2016-10-17 MED ORDER — SODIUM CHLORIDE 0.9 % IV BOLUS (SEPSIS)
150.0000 mL | Freq: Once | INTRAVENOUS | Status: AC
  Administered 2016-10-17: 150 mL via INTRAVENOUS

## 2016-10-17 MED ORDER — ACETAMINOPHEN 650 MG RE SUPP: 650.0000 mg | Freq: Four times a day (QID) | RECTAL | Status: DC | PRN

## 2016-10-17 MED ORDER — IRBESARTAN 150 MG PO TABS
75.0000 mg | ORAL_TABLET | Freq: Every day | ORAL | Status: DC
  Administered 2016-10-18 – 2016-10-19 (×2): 75 mg via ORAL
  Filled 2016-10-17 (×3): qty 1

## 2016-10-17 MED ORDER — METOCLOPRAMIDE HCL 5 MG/ML IJ SOLN: 5.0000 mg | Freq: Three times a day (TID) | INTRAMUSCULAR | Status: DC | PRN

## 2016-10-17 MED ORDER — ENOXAPARIN SODIUM 30 MG/0.3ML ~~LOC~~ SOLN
30.0000 mg | Freq: Two times a day (BID) | SUBCUTANEOUS | Status: DC
  Administered 2016-10-18 – 2016-10-21 (×7): 30 mg via SUBCUTANEOUS
  Filled 2016-10-17 (×6): qty 0.3

## 2016-10-17 MED ORDER — ACETAMINOPHEN 10 MG/ML IV SOLN
1000.0000 mg | Freq: Three times a day (TID) | INTRAVENOUS | Status: AC
  Administered 2016-10-18 (×3): 1000 mg via INTRAVENOUS
  Filled 2016-10-17 (×3): qty 100

## 2016-10-17 MED ORDER — METHOCARBAMOL 1000 MG/10ML IJ SOLN
500.0000 mg | Freq: Four times a day (QID) | INTRAVENOUS | Status: DC | PRN
  Filled 2016-10-17: qty 5

## 2016-10-17 MED ORDER — LIDOCAINE HCL (PF) 2 % IJ SOLN
INTRAMUSCULAR | Status: AC
  Filled 2016-10-17: qty 2

## 2016-10-17 MED ORDER — MORPHINE SULFATE (PF) 10 MG/ML IV SOLN
INTRAVENOUS | Status: AC
  Filled 2016-10-17: qty 1

## 2016-10-17 MED ORDER — BUPIVACAINE HCL (PF) 0.5 % IJ SOLN
INTRAMUSCULAR | Status: DC | PRN
  Administered 2016-10-17: 3 mL

## 2016-10-17 MED ORDER — FENTANYL CITRATE (PF) 100 MCG/2ML IJ SOLN
INTRAMUSCULAR | Status: DC | PRN
  Administered 2016-10-17: 25 ug via INTRAVENOUS

## 2016-10-17 MED ORDER — ACETAMINOPHEN 10 MG/ML IV SOLN
INTRAVENOUS | Status: DC | PRN
  Administered 2016-10-17: 1000 mg via INTRAVENOUS

## 2016-10-17 MED ORDER — MENTHOL 3 MG MT LOZG
1.0000 | LOZENGE | OROMUCOSAL | Status: DC | PRN
  Filled 2016-10-17: qty 9

## 2016-10-17 MED ORDER — VANCOMYCIN HCL IN DEXTROSE 1-5 GM/200ML-% IV SOLN
INTRAVENOUS | Status: AC
  Administered 2016-10-17: 1000 mg via INTRAVENOUS
  Filled 2016-10-17: qty 200

## 2016-10-17 MED ORDER — ATROPINE SULFATE 1 MG/ML IJ SOLN
1.0000 mg | Freq: Once | INTRAMUSCULAR | Status: DC
  Filled 2016-10-17: qty 1

## 2016-10-17 MED ORDER — EPINEPHRINE PF 1 MG/ML IJ SOLN
INTRAMUSCULAR | Status: AC
  Filled 2016-10-17: qty 1

## 2016-10-17 MED ORDER — ATROPINE SULFATE 1 MG/10ML IJ SOSY: 1.0000 mg | PREFILLED_SYRINGE | Freq: Once | INTRAMUSCULAR | Status: DC

## 2016-10-17 MED ORDER — DOCUSATE SODIUM 100 MG PO CAPS
100.0000 mg | ORAL_CAPSULE | Freq: Two times a day (BID) | ORAL | Status: DC
  Administered 2016-10-18 – 2016-10-21 (×7): 100 mg via ORAL
  Filled 2016-10-17 (×7): qty 1

## 2016-10-17 MED ORDER — BUPIVACAINE HCL (PF) 0.25 % IJ SOLN
INTRAMUSCULAR | Status: AC
  Filled 2016-10-17: qty 30

## 2016-10-17 MED ORDER — ZOLPIDEM TARTRATE 5 MG PO TABS: 5.0000 mg | ORAL_TABLET | Freq: Every evening | ORAL | Status: DC | PRN

## 2016-10-17 MED ORDER — ONDANSETRON HCL 4 MG/2ML IJ SOLN
4.0000 mg | Freq: Four times a day (QID) | INTRAMUSCULAR | Status: DC | PRN
  Administered 2016-10-17: 4 mg via INTRAVENOUS
  Filled 2016-10-17: qty 2

## 2016-10-17 MED ORDER — SODIUM CHLORIDE 0.9 % IJ SOLN
INTRAMUSCULAR | Status: AC
  Filled 2016-10-17: qty 100

## 2016-10-17 MED ORDER — FAMOTIDINE 20 MG PO TABS
20.0000 mg | ORAL_TABLET | Freq: Once | ORAL | Status: AC
  Administered 2016-10-17: 20 mg via ORAL

## 2016-10-17 MED ORDER — ATROPINE SULFATE 1 MG/10ML IJ SOSY
PREFILLED_SYRINGE | INTRAMUSCULAR | Status: AC
  Administered 2016-10-17: 1 mg
  Filled 2016-10-17: qty 10

## 2016-10-17 MED ORDER — ACETAMINOPHEN 325 MG PO TABS
650.0000 mg | ORAL_TABLET | Freq: Four times a day (QID) | ORAL | Status: DC | PRN
  Administered 2016-10-17 – 2016-10-21 (×8): 650 mg via ORAL
  Filled 2016-10-17 (×9): qty 2

## 2016-10-17 MED ORDER — NEOMYCIN-POLYMYXIN B GU 40-200000 IR SOLN
Status: DC | PRN
  Administered 2016-10-17: 16 mL

## 2016-10-17 MED ORDER — PHENOL 1.4 % MT LIQD
1.0000 | OROMUCOSAL | Status: DC | PRN
  Filled 2016-10-17: qty 177

## 2016-10-17 MED ORDER — ACETAMINOPHEN 10 MG/ML IV SOLN
INTRAVENOUS | Status: AC
  Filled 2016-10-17: qty 100

## 2016-10-17 MED ORDER — POTASSIUM CHLORIDE CRYS ER 20 MEQ PO TBCR
20.0000 meq | EXTENDED_RELEASE_TABLET | Freq: Every day | ORAL | Status: DC
  Administered 2016-10-18 – 2016-10-21 (×4): 20 meq via ORAL
  Filled 2016-10-17 (×4): qty 1

## 2016-10-17 MED ORDER — BISACODYL 10 MG RE SUPP: 10.0000 mg | Freq: Every day | RECTAL | Status: DC | PRN

## 2016-10-17 MED ORDER — MIDAZOLAM HCL 5 MG/5ML IJ SOLN
INTRAMUSCULAR | Status: DC | PRN
  Administered 2016-10-17: 2 mg via INTRAVENOUS

## 2016-10-17 MED ORDER — MORPHINE SULFATE (PF) 2 MG/ML IV SOLN: 2.0000 mg | INTRAVENOUS | Status: DC | PRN

## 2016-10-17 MED ORDER — OXYCODONE HCL 5 MG PO TABS
5.0000 mg | ORAL_TABLET | ORAL | Status: DC | PRN
  Administered 2016-10-17: 10 mg via ORAL
  Filled 2016-10-17: qty 2

## 2016-10-17 MED ORDER — METOCLOPRAMIDE HCL 10 MG PO TABS: 5.0000 mg | ORAL_TABLET | Freq: Three times a day (TID) | ORAL | Status: DC | PRN

## 2016-10-17 MED ORDER — FAMOTIDINE 20 MG PO TABS
ORAL_TABLET | ORAL | Status: AC
  Administered 2016-10-17: 20 mg via ORAL
  Filled 2016-10-17: qty 1

## 2016-10-17 MED ORDER — ONDANSETRON HCL 4 MG PO TABS: 4.0000 mg | ORAL_TABLET | Freq: Four times a day (QID) | ORAL | Status: DC | PRN

## 2016-10-17 MED ORDER — ALUM & MAG HYDROXIDE-SIMETH 200-200-20 MG/5ML PO SUSP: 30.0000 mL | ORAL | Status: DC | PRN

## 2016-10-17 MED ORDER — MAGNESIUM HYDROXIDE 400 MG/5ML PO SUSP: 30.0000 mL | Freq: Every day | ORAL | Status: DC | PRN

## 2016-10-17 MED ORDER — CEFAZOLIN SODIUM-DEXTROSE 2-4 GM/100ML-% IV SOLN
INTRAVENOUS | Status: AC
  Filled 2016-10-17: qty 100

## 2016-10-17 MED ORDER — MIDAZOLAM HCL 2 MG/2ML IJ SOLN
INTRAMUSCULAR | Status: AC
  Filled 2016-10-17: qty 2

## 2016-10-17 MED ORDER — NEOMYCIN-POLYMYXIN B GU 40-200000 IR SOLN
Status: AC
  Filled 2016-10-17: qty 20

## 2016-10-17 MED ORDER — MAGNESIUM CITRATE PO SOLN
1.0000 | Freq: Once | ORAL | Status: DC | PRN
  Filled 2016-10-17: qty 296

## 2016-10-17 MED ORDER — SODIUM CHLORIDE 0.9 % IV SOLN
INTRAVENOUS | Status: DC
  Administered 2016-10-18: 02:00:00 via INTRAVENOUS

## 2016-10-17 SURGICAL SUPPLY — 67 items
BANDAGE ACE 6X5 VEL STRL LF (GAUZE/BANDAGES/DRESSINGS) ×3 IMPLANT
BLADE SAW 1 (BLADE) ×3 IMPLANT
BLOCK CUTTING FEMUR 5 RT MED (MISCELLANEOUS) IMPLANT
BLOCK CUTTING TIBIAL 4 RT MED (MISCELLANEOUS) IMPLANT
CANISTER SUCT 1200ML W/VALVE (MISCELLANEOUS) ×3 IMPLANT
CANISTER SUCT 3000ML PPV (MISCELLANEOUS) ×6 IMPLANT
CAPT KNEE TOTAL 3 ×2 IMPLANT
CATH FOL LEG HOLDER (MISCELLANEOUS) ×3 IMPLANT
CATH TRAY METER 16FR LF (MISCELLANEOUS) ×3 IMPLANT
CEMENT HV SMART SET (Cement) ×6 IMPLANT
CHLORAPREP W/TINT 26ML (MISCELLANEOUS) ×6 IMPLANT
COOLER POLAR GLACIER W/PUMP (MISCELLANEOUS) ×3 IMPLANT
CUFF TOURN 24 STER (MISCELLANEOUS) IMPLANT
CUFF TOURN 30 STER DUAL PORT (MISCELLANEOUS) ×2 IMPLANT
DRAPE SHEET LG 3/4 BI-LAMINATE (DRAPES) ×6 IMPLANT
ELECT CAUTERY BLADE 6.4 (BLADE) ×3 IMPLANT
ELECT REM PT RETURN 9FT ADLT (ELECTROSURGICAL) ×3
ELECTRODE REM PT RTRN 9FT ADLT (ELECTROSURGICAL) ×1 IMPLANT
FEMUR BONE MODEL IMPLANT
GAUZE PETRO XEROFOAM 1X8 (MISCELLANEOUS) ×3 IMPLANT
GAUZE SPONGE 4X4 12PLY STRL (GAUZE/BANDAGES/DRESSINGS) ×3 IMPLANT
GLOVE BIOGEL PI IND STRL 7.0 (GLOVE) IMPLANT
GLOVE BIOGEL PI IND STRL 9 (GLOVE) ×1 IMPLANT
GLOVE BIOGEL PI INDICATOR 7.0 (GLOVE) ×8
GLOVE BIOGEL PI INDICATOR 9 (GLOVE) ×2
GLOVE INDICATOR 8.0 STRL GRN (GLOVE) ×5 IMPLANT
GLOVE SURG ORTHO 8.0 STRL STRW (GLOVE) ×5 IMPLANT
GLOVE SURG SYN 9.0  PF PI (GLOVE) ×2
GLOVE SURG SYN 9.0 PF PI (GLOVE) ×1 IMPLANT
GOWN SRG 2XL LVL 4 RGLN SLV (GOWNS) ×1 IMPLANT
GOWN STRL NON-REIN 2XL LVL4 (GOWNS) ×3
GOWN STRL REUS W/ TWL LRG LVL3 (GOWN DISPOSABLE) ×1 IMPLANT
GOWN STRL REUS W/ TWL XL LVL3 (GOWN DISPOSABLE) ×1 IMPLANT
GOWN STRL REUS W/TWL LRG LVL3 (GOWN DISPOSABLE) ×6
GOWN STRL REUS W/TWL XL LVL3 (GOWN DISPOSABLE) ×3
HOOD PEEL AWAY FLYTE STAYCOOL (MISCELLANEOUS) ×6 IMPLANT
IMMBOLIZER KNEE 19 BLUE UNIV (SOFTGOODS) ×1 IMPLANT
KIT RM TURNOVER STRD PROC AR (KITS) ×3 IMPLANT
KNEE MEDACTA TIBIAL/FEMORAL BL (Knees) ×2 IMPLANT
KNIFE SCULPS 14X20 (INSTRUMENTS) ×3 IMPLANT
NDL SAFETY 18GX1.5 (NEEDLE) ×3 IMPLANT
NDL SPNL 18GX3.5 QUINCKE PK (NEEDLE) ×1 IMPLANT
NDL SPNL 20GX3.5 QUINCKE YW (NEEDLE) ×1 IMPLANT
NEEDLE SPNL 18GX3.5 QUINCKE PK (NEEDLE) ×3 IMPLANT
NEEDLE SPNL 20GX3.5 QUINCKE YW (NEEDLE) ×3 IMPLANT
NS IRRIG 1000ML POUR BTL (IV SOLUTION) ×3 IMPLANT
PACK TOTAL KNEE (MISCELLANEOUS) ×3 IMPLANT
PAD WRAPON POLAR KNEE (MISCELLANEOUS) ×1 IMPLANT
PULSAVAC PLUS IRRIG FAN TIP (DISPOSABLE) ×3
SOL .9 NS 3000ML IRR  AL (IV SOLUTION) ×2
SOL .9 NS 3000ML IRR AL (IV SOLUTION) ×1
SOL .9 NS 3000ML IRR UROMATIC (IV SOLUTION) ×1 IMPLANT
STAPLER SKIN PROX 35W (STAPLE) ×3 IMPLANT
SUCTION FRAZIER HANDLE 10FR (MISCELLANEOUS) ×2
SUCTION TUBE FRAZIER 10FR DISP (MISCELLANEOUS) ×1 IMPLANT
SUT DVC 2 QUILL PDO  T11 36X36 (SUTURE) ×2
SUT DVC 2 QUILL PDO T11 36X36 (SUTURE) ×1 IMPLANT
SUT ETHIBOND NAB CT1 #1 30IN (SUTURE) ×2 IMPLANT
SUT V-LOC 90 ABS DVC 3-0 CL (SUTURE) ×3 IMPLANT
SYR 20CC LL (SYRINGE) ×3 IMPLANT
SYR 3ML 18GX1 1/2 (SYRINGE) ×4 IMPLANT
SYR 50ML LL SCALE MARK (SYRINGE) ×6 IMPLANT
TIBIAL BONE MODEL ×2 IMPLANT
TIP FAN IRRIG PULSAVAC PLUS (DISPOSABLE) ×1 IMPLANT
TOWEL OR 17X26 4PK STRL BLUE (TOWEL DISPOSABLE) ×3 IMPLANT
TOWER CARTRIDGE SMART MIX (DISPOSABLE) ×3 IMPLANT
WRAPON POLAR PAD KNEE (MISCELLANEOUS) ×3

## 2016-10-17 NOTE — Anesthesia Preprocedure Evaluation (Signed)
Anesthesia Evaluation  Patient identified by MRN, date of birth, ID band Patient awake    Reviewed: Allergy & Precautions, H&P , NPO status , Patient's Chart, lab work & pertinent test results, reviewed documented beta blocker date and time   Airway Mallampati: II   Neck ROM: full    Dental  (+) Poor Dentition   Pulmonary neg pulmonary ROS,    Pulmonary exam normal        Cardiovascular hypertension, negative cardio ROS Normal cardiovascular exam Rhythm:regular Rate:Normal     Neuro/Psych negative neurological ROS  negative psych ROS   GI/Hepatic negative GI ROS, Neg liver ROS,   Endo/Other  negative endocrine ROS  Renal/GU negative Renal ROS  negative genitourinary   Musculoskeletal   Abdominal   Peds  Hematology negative hematology ROS (+)   Anesthesia Other Findings Past Medical History: No date: Hypertension No date: Plantar fasciitis     Comment:  both feet Past Surgical History: No date: APPENDECTOMY No date: KNEE ARTHROSCOPY; Right No date: TONSILLECTOMY BMI    Body Mass Index:  29.05 kg/m     Reproductive/Obstetrics negative OB ROS                             Anesthesia Physical Anesthesia Plan  ASA: III  Anesthesia Plan: General   Post-op Pain Management:    Induction:   PONV Risk Score and Plan:   Airway Management Planned:   Additional Equipment:   Intra-op Plan:   Post-operative Plan:   Informed Consent: I have reviewed the patients History and Physical, chart, labs and discussed the procedure including the risks, benefits and alternatives for the proposed anesthesia with the patient or authorized representative who has indicated his/her understanding and acceptance.   Dental Advisory Given  Plan Discussed with: CRNA  Anesthesia Plan Comments:         Anesthesia Quick Evaluation

## 2016-10-17 NOTE — Progress Notes (Signed)
Chaplain received a Rapid Response Page. Patient had a decrease in heart rate due to an allergic reaction to Oxycodone . Once patient was stable I prayed with patient and her husband. Patient had surgery on her leg. And was receiving pain medicines when she had her episode.

## 2016-10-17 NOTE — Significant Event (Signed)
Rapid Response Event Note  Overview: Called to room 147 for pt hypotensive and bradycardic.       Initial Focused Assessment: Pt supine in bed, vitals as noted on flowsheet. Pt had been given 2 oxycodone earlier. Pt stated this has happened before with narcotic pain medicine.   Interventions: fluid bolus, 12 lead and atropine as doucmented  Plan of Care (if not transferred): Orders for narcan obtained by pt's nurse and will be given if needed.   Event Summary: Name of Physician Notified: Willis at 2120    at    Outcome: Stayed in room and stabalized  Event End Time: 2140  Jaime Hawkins A

## 2016-10-17 NOTE — Op Note (Signed)
10/17/2016  4:18 PM  PATIENT:  Jaime Hawkins  66 y.o. female  PRE-OPERATIVE DIAGNOSIS:  primary osteoarthritis of right knee   POST-OPERATIVE DIAGNOSIS:  primary osteoarthritis of right knee   PROCEDURE:  Procedure(s): TOTAL KNEE ARTHROPLASTY (Right)  SURGEON: Laurene Footman, MD  ASSISTANTS: Rachelle Hora [facility]  ANESTHESIA:   spinal  EBL:  Total I/O In: 1000 [I.V.:1000] Out: 300 [Urine:200; Blood:100]  BLOOD ADMINISTERED:none  DRAINS: none   LOCAL MEDICATIONS USED:  MARCAINE    and OTHER morphine and Exparel  SPECIMEN:  No Specimen  DISPOSITION OF SPECIMEN:  N/A  COUNTS:  YES  TOURNIQUET:   64 minutes at 300 mmHg  IMPLANTS: GMK 5 right femur with 4 right tibia with a 14 mm PS insert, 11 x 30 stem and 2 patella all components cemented  DICTATION: .Dragon Dictation  patient brought the operating room and after adequate spinal anesthesia was obtained the right leg was prepped draped in sterile fashion was turned by the upper thigh. After patient identification and timeout procedures were completed, tourniquet was raised and a midline skin incision was made with the knee in flexion followed by medial parapatellar arthrotomy. There is sclerotic bone in the lateral compartment with significant wear to the femoral condyle and lateral  tibial condyle with moderate patellofemoral and  medial compartment changes. Anterior cruciate ligament fat pad were excised. Medacta cutting guide applied after removing cartilage off the anterior aspect of the tibia and proximal tibia cut carried out. Next the distal femoral cut was carried out after applying the my knee cutting guide to it. The 5 cutting block was applied anterior posterior and chamfer cuts made with no notching. Residual PCL removed at this time with excision of the posterior horns of the menisci. The 4 tibia baseplate was placed with appropriate rotation based on the preop templating and pin from the my knee guide and proximal  tibial preparation carried out with drilling and placement of the keel. The 5 femoral trial was placed a 14 mm insert gave excellent stability through range of motion was chosen for the final component. Distal femoral drill holes were made followed by the trochlear groove cut and drilling for the PS implant. Trials were removed and the knee held in extension and patella cut using the patellar cutting guide and sized to a size 2 after drilling holes were made. This tourniquet was let down at this point with the injection given and hemostasis checked with electrocautery. Tourniquet was then raised and the bony surfaces thoroughly irrigated and dried the tibial component was cemented into place first with removal of excess cement followed by placement of the tibial polyethylene component with set screw using torque screwdriver. The distal femoral component was then applied and the knee held in extension with patellar button clamped into place with cement being used. After the cement had set excess cement was removed and the knee thoroughly irrigated with a tourniquet let down. Patella tracked well with no touch technique after lateral release secondary to the valgus knee. The arthrotomy was repaired using 0 Ethibond followed by the a heavy Quill followed by 3-0 v-loc subcutaneously. Skin was closed with staples followed by dressing of Xeroform 4 x 4's web roll Ace wrap and Polar Care  PLAN OF CARE: Admit to inpatient   PATIENT DISPOSITION:  PACU - hemodynamically stable.

## 2016-10-17 NOTE — Anesthesia Post-op Follow-up Note (Signed)
Anesthesia QCDR form completed.        

## 2016-10-17 NOTE — Progress Notes (Signed)
Dr. Marry Guan notified of rapid response. Oxycodone d/c'd. Tylenol IV 1 gram q8h ordered. Tramadol 50-100mg  q4h prn ordered.

## 2016-10-17 NOTE — Progress Notes (Signed)
Pt. Became bradycardic and hypotensive. Rapid response called and Dr. Jannifer Franklin came to assess pt.  Atropine pushed. Bolus of 150cc ran. Pt. Vital signs are stable and pt. Is awake, alert and voices she's feeling better.

## 2016-10-17 NOTE — OR Nursing (Signed)
No K+ per dr Andree Elk

## 2016-10-17 NOTE — OR Nursing (Signed)
Patient noted with intense itching around her forehead and scalp after vancomycin was initiated for about 15 to 20 minutes. Vancomycin was stopped.  Patient noted with redness and rash to scalp.  Dr. Rudene Christians at bedside. No further orders at this time.  Patient states itching was relieved slightly after the vancomycin was stopped.  Patient to or with RN.

## 2016-10-17 NOTE — Transfer of Care (Signed)
Immediate Anesthesia Transfer of Care Note  Patient: Jaime Hawkins  Procedure(s) Performed: Procedure(s): TOTAL KNEE ARTHROPLASTY (Right)  Patient Location: PACU  Anesthesia Type:Spinal  Level of Consciousness: sedated  Airway & Oxygen Therapy: Patient Spontanous Breathing and Patient connected to face mask oxygen  Post-op Assessment: Report given to RN and Post -op Vital signs reviewed and stable  Post vital signs: Reviewed and stable  Last Vitals:  Vitals:   10/17/16 1259 10/17/16 1624  BP: (!) 142/77 (!) 85/47  Pulse: 74 74  Resp: 16 14  Temp: 36.7 C (!) 36.1 C    Last Pain:  Vitals:   10/17/16 1259  TempSrc: Oral         Complications: No apparent anesthesia complications

## 2016-10-17 NOTE — H&P (Signed)
Reviewed paper H+P, will be scanned into chart. No changes noted.  

## 2016-10-17 NOTE — Anesthesia Procedure Notes (Signed)
Spinal  Patient location during procedure: OR Start time: 10/17/2016 2:14 PM End time: 10/17/2016 2:17 PM Staffing Anesthesiologist: Molli Barrows Resident/CRNA: Hedda Slade Performed: resident/CRNA  Preanesthetic Checklist Completed: patient identified, site marked, surgical consent, pre-op evaluation, timeout performed, IV checked, risks and benefits discussed and monitors and equipment checked Spinal Block Patient position: sitting Prep: ChloraPrep Patient monitoring: heart rate, continuous pulse ox, blood pressure and cardiac monitor Approach: midline Location: L4-5 Injection technique: single-shot Needle Needle type: Introducer and Pencan  Needle gauge: 24 G Needle length: 9 cm Assessment Sensory level: T10 Additional Notes Negative paresthesia. Negative blood return. Positive free-flowing CSF. Expiration date of kit checked and confirmed. Patient tolerated procedure well, without complications.

## 2016-10-17 NOTE — Progress Notes (Signed)
Patient admitted to the unit. Patient alert &oriented x 4. Patient was educated about call system. Polar care, foot pumps and bone foam in place. Place to the lowest position. Admission and skin assessment completed. Patient verbalizes numbness in LE bilateral.

## 2016-10-18 ENCOUNTER — Encounter: Payer: Self-pay | Admitting: Orthopedic Surgery

## 2016-10-18 LAB — CBC
HCT: 33.2 % — ABNORMAL LOW (ref 35.0–47.0)
Hemoglobin: 11.5 g/dL — ABNORMAL LOW (ref 12.0–16.0)
MCH: 31.7 pg (ref 26.0–34.0)
MCHC: 34.6 g/dL (ref 32.0–36.0)
MCV: 91.5 fL (ref 80.0–100.0)
PLATELETS: 185 10*3/uL (ref 150–440)
RBC: 3.62 MIL/uL — AB (ref 3.80–5.20)
RDW: 13.1 % (ref 11.5–14.5)
WBC: 10.1 10*3/uL (ref 3.6–11.0)

## 2016-10-18 LAB — BASIC METABOLIC PANEL
Anion gap: 7 (ref 5–15)
BUN: 16 mg/dL (ref 6–20)
CHLORIDE: 101 mmol/L (ref 101–111)
CO2: 29 mmol/L (ref 22–32)
CREATININE: 1.1 mg/dL — AB (ref 0.44–1.00)
Calcium: 8.3 mg/dL — ABNORMAL LOW (ref 8.9–10.3)
GFR calc Af Amer: 60 mL/min — ABNORMAL LOW (ref >60)
GFR, EST NON AFRICAN AMERICAN: 52 mL/min — AB (ref >60)
GLUCOSE: 143 mg/dL — AB (ref 65–99)
Potassium: 3.1 mmol/L — ABNORMAL LOW (ref 3.5–5.1)
SODIUM: 137 mmol/L (ref 135–145)

## 2016-10-18 MED ORDER — LIDOCAINE 5 % EX PTCH
1.0000 | MEDICATED_PATCH | CUTANEOUS | Status: DC
  Administered 2016-10-18 – 2016-10-20 (×3): 1 via TRANSDERMAL
  Filled 2016-10-18 (×6): qty 1

## 2016-10-18 MED ORDER — POTASSIUM CHLORIDE 20 MEQ PO PACK: 20.0000 meq | PACK | Freq: Four times a day (QID) | ORAL | Status: DC

## 2016-10-18 MED ORDER — POTASSIUM CHLORIDE 20 MEQ PO PACK
20.0000 meq | PACK | Freq: Three times a day (TID) | ORAL | Status: AC
  Administered 2016-10-18 (×3): 20 meq via ORAL
  Filled 2016-10-18 (×3): qty 1

## 2016-10-18 NOTE — Progress Notes (Signed)
OT Cancellation Note  Patient Details Name: KONNIE NOFFSINGER MRN: [id number] DOB: 09-27-1950   Cancelled Treatment:    Reason Eval/Treat Not Completed: Patient at procedure or test/ unavailable. Order received, chart reviewed. Upon attempt, pt working with PT. Will re-attempt OT evaluation at later date/time as pt is available.  Jeni Salles, MPH, MS, OTR/L ascom [phone] 10/18/16, 2:09 PM

## 2016-10-18 NOTE — Progress Notes (Signed)
Clinical Social Worker (CSW) received SNF consult. PT is recommending home health. RN case manager aware of above. Please reconsult if future social work needs arise. CSW signing off.   Dontavious Emily, LCSW [phone] 

## 2016-10-18 NOTE — NC FL2 (Signed)
[place] LEVEL OF CARE SCREENING TOOL     IDENTIFICATION  Patient Name: Jaime Hawkins Birthdate: [birth year] Sex: female Admission Date (Current Location): 10/17/2016  [place] and [place] Number:  Engineering geologist and Address:  [facility], [address] [place], [place] [address]      Provider Number: [id number]  Attending Physician Name and Address:  Hessie Knows, MD  Relative Name and Phone Number:       Current Level of Care: Hospital Recommended Level of Care: [place] Prior Approval Number:    Date Approved/Denied:   PASRR Number:  [id number])  Discharge Plan: SNF    Current Diagnoses: Patient Active Problem List   Diagnosis Date Noted  . Localized osteoarthritis of right knee 10/17/2016  . Arthritis, degenerative 08/16/2015  . Essential (primary) hypertension 08/16/2015    Orientation RESPIRATION BLADDER Height & Weight     Self, Time, Situation, Place  Normal Continent Weight: 180 lb (81.6 kg) Height:     BEHAVIORAL SYMPTOMS/MOOD NEUROLOGICAL BOWEL NUTRITION STATUS   (none)  (none) Continent Diet (Regular Diet )  AMBULATORY STATUS COMMUNICATION OF NEEDS Skin   Extensive Assist Verbally Surgical wounds (Incision: Right Knee )                       Personal Care Assistance Level of Assistance  Bathing, Feeding, Dressing Bathing Assistance: Limited assistance Feeding assistance: Independent Dressing Assistance: Limited assistance     Functional Limitations Info  Sight, Hearing, Speech Sight Info: Adequate Hearing Info: Adequate Speech Info: Adequate    SPECIAL CARE FACTORS FREQUENCY  PT (By licensed PT), OT (By licensed OT)     PT Frequency:  (5) OT Frequency:  (5)            Contractures      Additional Factors Info  Code Status, Allergies, Isolation Precautions Code Status Info:  (Full Code. ) Allergies Info:  (Oxycodone, Septra  Sulfamethoxazole-trimethoprim, Vancomycin)     Isolation Precautions Info:  (MRSA Nasal Swab. )     Current Medications (10/18/2016):  This is the current hospital active medication list Current Facility-Administered Medications  Medication Dose Route Frequency Provider Last Rate Last Dose  . 0.9 %  sodium chloride infusion   Intravenous Continuous Hessie Knows, MD 75 mL/hr at 10/18/16 0157    . acetaminophen (OFIRMEV) IV 1,000 mg  1,000 mg Intravenous TID Dereck Leep, MD   Stopped at 10/18/16 [phone]  . acetaminophen (TYLENOL) tablet 650 mg  650 mg Oral Q6H PRN Hessie Knows, MD   650 mg at 10/17/16 2135   Or  . acetaminophen (TYLENOL) suppository 650 mg  650 mg Rectal Q6H PRN Hessie Knows, MD      . alum & mag hydroxide-simeth (MAALOX/MYLANTA) 200-200-20 MG/5ML suspension 30 mL  30 mL Oral Q4H PRN Hessie Knows, MD      . atropine 1 MG/10ML injection 1 mg  1 mg Intravenous Once Hessie Knows, MD      . bisacodyl (DULCOLAX) suppository 10 mg  10 mg Rectal Daily PRN Hessie Knows, MD      . diphenhydrAMINE (BENADRYL) 12.5 MG/5ML elixir 12.5-25 mg  12.5-25 mg Oral Q4H PRN Hessie Knows, MD      . docusate sodium (COLACE) capsule 100 mg  100 mg Oral BID Hessie Knows, MD      . enoxaparin (LOVENOX) injection 30 mg  30 mg Subcutaneous Q12H Hessie Knows, MD   30 mg at  10/18/16 0737  . irbesartan (AVAPRO) tablet 75 mg  75 mg Oral Daily Hessie Knows, MD      . magnesium citrate solution 1 Bottle  1 Bottle Oral Once PRN Hessie Knows, MD      . magnesium hydroxide (MILK OF MAGNESIA) suspension 30 mL  30 mL Oral Daily PRN Hessie Knows, MD      . menthol-cetylpyridinium (CEPACOL) lozenge 3 mg  1 lozenge Oral PRN Hessie Knows, MD       Or  . phenol (CHLORASEPTIC) mouth spray 1 spray  1 spray Mouth/Throat PRN Hessie Knows, MD      . methocarbamol (ROBAXIN) tablet 500 mg  500 mg Oral Q6H PRN Hessie Knows, MD       Or  . methocarbamol (ROBAXIN) 500 mg in dextrose 5 % 50 mL IVPB  500 mg  Intravenous Q6H PRN Hessie Knows, MD      . metoCLOPramide (REGLAN) tablet 5-10 mg  5-10 mg Oral Q8H PRN Hessie Knows, MD       Or  . metoCLOPramide (REGLAN) injection 5-10 mg  5-10 mg Intravenous Q8H PRN Hessie Knows, MD      . morphine 2 MG/ML injection 2 mg  2 mg Intravenous Q1H PRN Hessie Knows, MD      . naloxone [facility]) 0.4 MG/ML injection           . ondansetron (ZOFRAN) tablet 4 mg  4 mg Oral Q6H PRN Hessie Knows, MD       Or  . ondansetron [facility]) injection 4 mg  4 mg Intravenous Q6H PRN Hessie Knows, MD   4 mg at 10/17/16 2103  . potassium chloride (KLOR-CON) packet 20 mEq  20 mEq Oral QID Duanne Guess, PA-C      . potassium chloride SA (K-DUR,KLOR-CON) CR tablet 20 mEq  20 mEq Oral Daily Hessie Knows, MD      . traMADol Veatrice Bourbon) tablet 50-100 mg  50-100 mg Oral Q6H PRN Hooten, Laurice Record, MD      . triamterene-hydrochlorothiazide (MAXZIDE-25) 37.5-25 MG per tablet 1 tablet  1 tablet Oral Daily Hessie Knows, MD      . Derrill Memo ON 10/20/2016] Vitamin D (Ergocalciferol) (DRISDOL) capsule 50,000 Units  50,000 Units Oral Q7 days Hessie Knows, MD      . zolpidem [facility]) tablet 5 mg  5 mg Oral QHS PRN Hessie Knows, MD         Discharge Medications: Please see discharge summary for a list of discharge medications.  Relevant Imaging Results:  Relevant Lab Results:   Additional Information  (SSN: [id number])  Dekota Shenk, Veronia Beets, LCSW

## 2016-10-18 NOTE — Evaluation (Signed)
Physical Therapy Evaluation Patient Details Name: Jaime Hawkins MRN: [id number] DOB: 10-02-1950 Today's Date: 10/18/2016   History of Present Illness  Pt is a 66 yo F with a diagnosis of primary OA of the R knee and is s/p elective R TKA.  Rapid response team called post op secondary to pt hypotensive with bradycardia after oxycodone.  BP improved. Only taking tylenol. Patient states she is sensitive to pain meds.    Clinical Impression  Pt presents with deficits in strength, transfers, mobility, gait, balance, R knee ROM, and activity tolerance.  Pt SBA with bed mobility tasks with extra time and effort required.  Pt CGA with sit to/from stand transfers with min verbal cues for sequencing.  Pt able to ambulate several steps forwards, backwards, and side stepping with good stability but limited by R knee pain.  Pt able to perform 10 Ind RLE SLRs and KI was not used during session with no signs of R knee buckling in WB.   Vital signs measured during session with HR in the high 50's to mid 60s bpm and BP around 120/40 mmHg with nursing present.  Pt reported no adverse symptoms during session.  Pt will benefit from HHPT services upon discharge to address above deficits for decreased caregiver assistance and safe return to PLOF.      Follow Up Recommendations Home health PT    Equipment Recommendations  Rolling walker with 5" wheels    Recommendations for Other Services       Precautions / Restrictions Precautions Precautions: Fall;Knee Precaution Booklet Issued: Yes (comment) Restrictions Weight Bearing Restrictions: Yes RLE Weight Bearing: Weight bearing as tolerated Other Position/Activity Restrictions: Pt able to perform 10 Ind RLE SLRs without extension lag, no KI donned during session with RLE showing no signs of buckling.      Mobility  Bed Mobility Overal bed mobility: Needs Assistance Bed Mobility: Sit to Supine;Supine to Sit     Supine to sit: Supervision Sit to  supine: Supervision   General bed mobility comments: Extra time and effort but no physical assistance required with bed mob tasks  Transfers Overall transfer level: Needs assistance Equipment used: Rolling walker (2 wheeled) Transfers: Sit to/from Stand Sit to Stand: Min guard         General transfer comment: Min verbal cues for proper sequencing with transfers  Ambulation/Gait Ambulation/Gait assistance: Min guard Ambulation Distance (Feet): 5 Feet Assistive device: Rolling walker (2 wheeled) Gait Pattern/deviations: Step-to pattern;Antalgic   Gait velocity interpretation: Below normal speed for age/gender General Gait Details: Pt steady with amb but limited by pain; no KI donned secondary to pt able to perform 10 Ind RLE SLRs with RLE showing no signs of buckling  Stairs            Wheelchair Mobility    Modified Rankin (Stroke Patients Only)       Balance Overall balance assessment: Needs assistance Sitting-balance support: Feet supported;Feet unsupported;Bilateral upper extremity supported Sitting balance-Leahy Scale: Normal     Standing balance support: Bilateral upper extremity supported Standing balance-Leahy Scale: Good                               Pertinent Vitals/Pain Pain Assessment: 0-10 Pain Score: 2  Pain Location: R knee Pain Descriptors / Indicators: Aching;Sore Pain Intervention(s): Premedicated before session;Monitored during session;Limited activity within patient's tolerance    Home Living Family/patient expects to be discharged to:: Private residence Living Arrangements:  Spouse/significant other Available Help at Discharge: Family;Available 24 hours/day Type of Home: House Home Access: Ramped entrance     Home Layout: One level Home Equipment: Walker - 4 wheels;Wheelchair - manual      Prior Function Level of Independence: Independent         Comments: Ind amb without AD limited community distances, Ind with  ADLs, no fall history     Hand Dominance   Dominant Hand: Right    Extremity/Trunk Assessment        Lower Extremity Assessment Lower Extremity Assessment: Generalized weakness       Communication   Communication: No difficulties  Cognition Arousal/Alertness: Awake/alert Behavior During Therapy: WFL for tasks assessed/performed Overall Cognitive Status: Within Functional Limits for tasks assessed                                        General Comments      Exercises Total Joint Exercises Ankle Circles/Pumps: AROM;Left;10 reps;15 reps Quad Sets: Right;10 reps;15 reps;Strengthening Gluteal Sets: Strengthening;Both;10 reps Straight Leg Raises: AROM;AAROM;Right;5 reps;10 reps Long Arc Quad: AROM;Right;10 reps;5 reps Knee Flexion: AROM;Right;5 reps;10 reps Goniometric ROM: R knee A/AAROM: flex 60/68 deg, ext -10/-4 deg Marching in Standing: AROM;Both;5 reps;10 reps Other Exercises Other Exercises: HEP education/review from TKA booklet   Assessment/Plan    PT Assessment Patient needs continued PT services  PT Problem List Decreased strength;Decreased range of motion;Decreased activity tolerance;Decreased balance;Decreased knowledge of use of DME;Decreased mobility       PT Treatment Interventions DME instruction;Gait training;Functional mobility training;Neuromuscular re-education;Balance training;Therapeutic exercise;Therapeutic activities;Patient/family education    PT Goals (Current goals can be found in the Care Plan section)  Acute Rehab PT Goals Patient Stated Goal: "To work in my yard again" PT Goal Formulation: With patient Time For Goal Achievement: 10/31/16 Potential to Achieve Goals: Good    Frequency BID   Barriers to discharge        Co-evaluation               AM-PAC PT "6 Clicks" Daily Activity  Outcome Measure Difficulty turning over in bed (including adjusting bedclothes, sheets and blankets)?: A Little Difficulty  moving from lying on back to sitting on the side of the bed? : A Little Difficulty sitting down on and standing up from a chair with arms (e.g., wheelchair, bedside commode, etc,.)?: Total Help needed moving to and from a bed to chair (including a wheelchair)?: A Little Help needed walking in hospital room?: A Lot Help needed climbing 3-5 steps with a railing? : A Lot 6 Click Score: 14    End of Session Equipment Utilized During Treatment: Gait belt Activity Tolerance: Patient limited by pain Patient left: in bed;with bed alarm set;with SCD's reapplied;with call bell/phone within reach;with family/visitor present (Polar care donned to RLE, B heels elevated with towel rolls) Nurse Communication: Mobility status;Other (comment) (Vitals monitored during session with nursing present or informed as session progressed) PT Visit Diagnosis: Other abnormalities of gait and mobility (R26.89);Muscle weakness (generalized) (M62.81)    Time: 6599-3570 PT Time Calculation (min) (ACUTE ONLY): 49 min   Charges:   PT Evaluation $PT Eval Low Complexity: 1 Low PT Treatments $Therapeutic Exercise: 8-22 mins $Therapeutic Activity: 8-22 mins   PT G Codes:        DRoyetta Asal PT, DPT 10/18/16, 11:05 AM

## 2016-10-18 NOTE — Progress Notes (Signed)
Foley d/c'd at 0600 

## 2016-10-18 NOTE — Care Management Note (Signed)
Case Management Note  Patient Details  Name: Jaime Hawkins MRN: [id number] Date of Birth: 02/18/1951  Subjective/Objective:  POD # 1  Right TKA. Met with patient and her spouse at bedside. He will be her caregiver at discharge. Prior to admission, patient was independent with adls. Offered choice of home health agencies and she prefers [facility] care [phone]). TC to [place] and spoke with diane. Requested a Friday visit. She confirmed that she could do this. Ordered walker from [place] with [facility]. [place], [place]  [phone] Called Lovenox 40 mg # 14 no refills. PCP is Dr. Kary Kos.                    Action/Plan: [facility] for PT. Lovenox called in, walker ordered from [facility]. Faxed all pertinent information to Diane at [facility] for new referral [phone])  Expected Discharge Date:                  Expected Discharge Plan:  [place]  In-House Referral:     Discharge planning Services  CM Consult  Post Acute Care Choice:  Durable Medical Equipment, Home Health Choice offered to:  Patient  DME Arranged:  Walker rolling DME Agency:  [place]:  PT [place]:  [place]  Status of Service:  In process, will continue to follow  If discussed at Long Length of Stay Meetings, dates discussed:    Additional Comments:  Jolly Mango, RN 10/18/2016, 1:42 PM

## 2016-10-18 NOTE — Progress Notes (Addendum)
   Subjective: 1 Day Post-Op Procedure(s) (LRB): TOTAL KNEE ARTHROPLASTY (Right) Patient reports pain as moderate.  Severe with bone foam Patient is with hypotension after oxycodone. BP improved. Only taking tylenol. States she is sensitive to pain meds. Denies any CP, SOB, ABD pain. We will start therapy today.    Objective: Vital signs in last 24 hours: Temp:  [97 F (36.1 C)-99.8 F (37.7 C)] 99.8 F (37.7 C) (08/08 0742) Pulse Rate:  [54-88] 54 (08/08 0742) Resp:  [10-24] 18 (08/07 2253) BP: (85-142)/(47-119) 139/60 (08/08 0742) SpO2:  [98 %-100 %] 100 % (08/08 0742) Weight:  [81.6 kg (180 lb)] 81.6 kg (180 lb) (08/07 1259)  Intake/Output from previous day: 08/07 0701 - 08/08 0700 In: 2450 [I.V.:2150; IV Piggyback:300] Out: 850 [Urine:750; Blood:100] Intake/Output this shift: No intake/output data recorded.   Recent Labs  10/17/16 2043 10/18/16 0347  HGB 12.1 11.5*    Recent Labs  10/17/16 2043 10/18/16 0347  WBC 13.6* 10.1  RBC 3.94 3.62*  HCT 35.3 33.2*  PLT 213 185    Recent Labs  10/17/16 2043 10/18/16 0347  NA  --  137  K  --  3.1*  CL  --  101  CO2  --  29  BUN  --  16  CREATININE 0.93 1.10*  GLUCOSE  --  143*  CALCIUM  --  8.3*   No results for input(s): LABPT, INR in the last 72 hours.  EXAM General - Patient is Alert, Appropriate and Oriented Extremity - Neurovascular intact Sensation intact distally Intact pulses distally Dorsiflexion/Plantar flexion intact No cellulitis present Compartment soft Dressing - dressing C/D/I and no drainage Motor Function - intact, moving foot and toes well on exam.   Past Medical History:  Diagnosis Date  . Hypertension   . Plantar fasciitis    both feet    Assessment/Plan:   1 Day Post-Op Procedure(s) (LRB): TOTAL KNEE ARTHROPLASTY (Right) Active Problems:   Localized osteoarthritis of right knee  Estimated body mass index is 29.05 kg/m as calculated from the following:   Height as of  10/11/16: 5\' 6"  (1.676 m).   Weight as of this encounter: 81.6 kg (180 lb). Advance diet Up with therapy  Needs BM Hypokalemia - Klor kon 20 mg TID Tylenol for pain, consider codeine if needed. Patient requesting not to take other meds such as tramadol or oxycodone CM to assist with discharge Recheck labs in the am  DVT Prophylaxis - Lovenox, Foot Pumps and TED hose Weight-Bearing as tolerated to right leg   T. Rachelle Hora, PA-C [place] 10/18/2016, 8:27 AM

## 2016-10-18 NOTE — Anesthesia Postprocedure Evaluation (Signed)
Anesthesia Post Note  Patient: Jaime Hawkins  Procedure(s) Performed: Procedure(s) (LRB): TOTAL KNEE ARTHROPLASTY (Right)  Patient location during evaluation: Nursing Unit Anesthesia Type: Spinal Level of consciousness: awake, awake and alert and oriented Pain management: pain level controlled Vital Signs Assessment: post-procedure vital signs reviewed and stable Respiratory status: spontaneous breathing, nonlabored ventilation and respiratory function stable Cardiovascular status: blood pressure returned to baseline and stable Postop Assessment: no headache and no backache Anesthetic complications: no     Last Vitals:  Vitals:   10/17/16 2253 10/17/16 2255  BP: (!) 135/119 138/82  Pulse: 67   Resp: 18   Temp: 37 C     Last Pain:  Vitals:   10/17/16 2253  TempSrc: Oral  PainSc:                  Johnna Acosta

## 2016-10-18 NOTE — Progress Notes (Signed)
Physical Therapy Treatment Patient Details Name: Jaime Hawkins MRN: [id number] DOB: [birth year] Today's Date: 10/18/2016    History of Present Illness Pt is a 66 yo F with a diagnosis of primary OA of the R knee and is s/p elective R TKA.  Rapid response team called post op secondary to pt hypotensive with bradycardia after oxycodone.  BP improved. Only taking tylenol. Patient states she is sensitive to pain meds.   PT Comments     Jaime Hawkins made excellent progress with mobility, ambulating 90 ft with RW and min guard assist.  Pt requires cues for safe and proper technique for sit<>stand transfers.  Pt will benefit from continued skilled PT services to increase functional independence and safety.   Follow Up Recommendations  Home health PT     Equipment Recommendations  Rolling walker with 5" wheels    Recommendations for Other Services       Precautions / Restrictions Precautions Precautions: Fall;Knee Precaution Booklet Issued: Yes (comment) Precaution Comments: Reviewed no pillow under knee Restrictions Weight Bearing Restrictions: Yes RLE Weight Bearing: Weight bearing as tolerated Other Position/Activity Restrictions: Pt able to perform 10 Ind RLE SLRs without extension lag, no KI donned during session with RLE showing no signs of buckling.    Mobility  Bed Mobility Overal bed mobility: Needs Assistance Bed Mobility: Supine to Sit     Supine to sit: Supervision;HOB elevated Sit to supine: Supervision   General bed mobility comments: Increased time and effort but does not require any physical assist or cues.  Transfers Overall transfer level: Needs assistance Equipment used: Rolling walker (2 wheeled) Transfers: Sit to/from Stand Sit to Stand: Min guard         General transfer comment: Cues for proper technique and for proper hand placement with sit<>stand.  Cues to back up all the way to the chair prior to sitting, pt initially attempted to  turn and sit prematurely.  Ambulation/Gait Ambulation/Gait assistance: Min guard Ambulation Distance (Feet): 90 Feet Assistive device: Rolling walker (2 wheeled) Gait Pattern/deviations: Step-to pattern;Decreased stride length;Decreased step length - left;Decreased stance time - right;Decreased weight shift to right;Antalgic Gait velocity: decreased Gait velocity interpretation: Below normal speed for age/gender General Gait Details: Cues for proper sequencing using RW.  Several standing rest breaks due to pain.     Stairs            Wheelchair Mobility    Modified Rankin (Stroke Patients Only)       Balance Overall balance assessment: Needs assistance Sitting-balance support: No upper extremity supported;Feet supported Sitting balance-Leahy Scale: Good     Standing balance support: Bilateral upper extremity supported;During functional activity Standing balance-Leahy Scale: Poor Standing balance comment: Relies on UE support for static and dynamic activities                            Cognition Arousal/Alertness: Awake/alert Behavior During Therapy: WFL for tasks assessed/performed Overall Cognitive Status: Within Functional Limits for tasks assessed                                        Exercises Total Joint Exercises Ankle Circles/Pumps: AROM;Both;10 reps;Supine Quad Sets: Strengthening;Both;10 reps;Supine Gluteal Sets: Strengthening;Both;10 reps Straight Leg Raises: AROM;Strengthening;Right;10 reps;Supine Long Arc Quad: AROM;Strengthening;Right;10 reps;Seated Knee Flexion: AAROM;Right;5 reps;Seated;Other (comment) (with 5 second holds) Goniometric ROM: 77 deg R knee flexion with  AAROM exercise Marching in Standing: AROM;Both;5 reps;10 reps Other Exercises Other Exercises: HEP education/review from TKA booklet    General Comments        Pertinent Vitals/Pain Pain Assessment: Faces Pain Score: 2  Faces Pain Scale: Hurts  worst Pain Location: R knee Pain Descriptors / Indicators: Crying;Aching;Grimacing;Guarding;Moaning (Crying with R knee flexion exercise) Pain Intervention(s): Limited activity within patient's tolerance;Monitored during session;Repositioned;Other (comment) (Polar care applied at end of session)    Home Living Family/patient expects to be discharged to:: Private residence Living Arrangements: Spouse/significant other Available Help at Discharge: Family;Available 24 hours/day Type of Home: House Home Access: Ramped entrance   Home Layout: One level Home Equipment: Walker - 4 wheels;Wheelchair - manual      Prior Function Level of Independence: Independent      Comments: Ind amb without AD limited community distances, Ind with ADLs, no fall history   PT Goals (current goals can now be found in the care plan section) Acute Rehab PT Goals Patient Stated Goal: to feel better PT Goal Formulation: With patient Time For Goal Achievement: 10/31/16 Potential to Achieve Goals: Good Progress towards PT goals: Progressing toward goals    Frequency    BID      PT Plan Current plan remains appropriate    Co-evaluation              AM-PAC PT "6 Clicks" Daily Activity  Outcome Measure  Difficulty turning over in bed (including adjusting bedclothes, sheets and blankets)?: A Little Difficulty moving from lying on back to sitting on the side of the bed? : A Little Difficulty sitting down on and standing up from a chair with arms (e.g., wheelchair, bedside commode, etc,.)?: A Little Help needed moving to and from a bed to chair (including a wheelchair)?: A Little Help needed walking in hospital room?: A Little Help needed climbing 3-5 steps with a railing? : A Lot 6 Click Score: 17    End of Session Equipment Utilized During Treatment: Gait belt Activity Tolerance: Patient limited by pain;Patient tolerated treatment well Patient left: in chair;with call bell/phone within  reach;with chair alarm set;with family/visitor present;Other (comment) (with polar care in place) Nurse Communication: Mobility status PT Visit Diagnosis: Other abnormalities of gait and mobility (R26.89);Muscle weakness (generalized) (M62.81)     Time: 9211-9417 PT Time Calculation (min) (ACUTE ONLY): 41 min  Charges:  $Gait Training: 8-22 mins $Therapeutic Exercise: 8-22 mins $Therapeutic Activity: 8-22 mins                    G Codes:       Collie Siad PT, DPT 10/18/2016, 2:25 PM

## 2016-10-19 LAB — BASIC METABOLIC PANEL
ANION GAP: 8 (ref 5–15)
BUN: 12 mg/dL (ref 6–20)
CO2: 27 mmol/L (ref 22–32)
Calcium: 8.4 mg/dL — ABNORMAL LOW (ref 8.9–10.3)
Chloride: 97 mmol/L — ABNORMAL LOW (ref 101–111)
Creatinine, Ser: 0.88 mg/dL (ref 0.44–1.00)
GFR calc Af Amer: >60 mL/min (ref >60)
Glucose, Bld: 170 mg/dL — ABNORMAL HIGH (ref 65–99)
POTASSIUM: 2.9 mmol/L — AB (ref 3.5–5.1)
SODIUM: 132 mmol/L — AB (ref 135–145)

## 2016-10-19 LAB — CBC
HCT: 33.6 % — ABNORMAL LOW (ref 35.0–47.0)
Hemoglobin: 11.6 g/dL — ABNORMAL LOW (ref 12.0–16.0)
MCH: 31.1 pg (ref 26.0–34.0)
MCHC: 34.5 g/dL (ref 32.0–36.0)
MCV: 90.1 fL (ref 80.0–100.0)
PLATELETS: 193 10*3/uL (ref 150–440)
RBC: 3.73 MIL/uL — AB (ref 3.80–5.20)
RDW: 13.1 % (ref 11.5–14.5)
WBC: 10.1 10*3/uL (ref 3.6–11.0)

## 2016-10-19 LAB — POTASSIUM: POTASSIUM: 4 mmol/L (ref 3.5–5.1)

## 2016-10-19 MED ORDER — ENOXAPARIN SODIUM 40 MG/0.4ML ~~LOC~~ SOLN: 40.0000 mg | SUBCUTANEOUS | 0 refills | Status: DC

## 2016-10-19 MED ORDER — ACETAMINOPHEN 500 MG PO TABS: 1000.0000 mg | ORAL_TABLET | Freq: Four times a day (QID) | ORAL | 0 refills | Status: DC | PRN

## 2016-10-19 MED ORDER — METHOCARBAMOL 500 MG PO TABS: 500.0000 mg | ORAL_TABLET | Freq: Four times a day (QID) | ORAL | 0 refills | Status: DC | PRN

## 2016-10-19 MED ORDER — ACETAMINOPHEN-CODEINE 120-12 MG/5ML PO SOLN
5.0000 mL | Freq: Four times a day (QID) | ORAL | Status: DC | PRN
  Administered 2016-10-19 (×2): 5 mL via ORAL
  Filled 2016-10-19 (×3): qty 5
  Filled 2016-10-19: qty 1

## 2016-10-19 MED ORDER — POTASSIUM CHLORIDE 20 MEQ PO PACK
40.0000 meq | PACK | Freq: Three times a day (TID) | ORAL | Status: AC
  Administered 2016-10-19 (×3): 40 meq via ORAL
  Filled 2016-10-19 (×3): qty 2

## 2016-10-19 NOTE — Discharge Summary (Addendum)
Physician Discharge Summary  Patient ID: Jaime Hawkins MRN: [id number] DOB/AGE: [birth year] 66 y.o.  Admit date: 10/17/2016 Discharge date: 10/21/2016  Admission Diagnoses:  primary osteoarthritis of right knee    Discharge Diagnoses: Patient Active Problem List   Diagnosis Date Noted  . Localized osteoarthritis of right knee 10/17/2016  . Arthritis, degenerative 08/16/2015  . Essential (primary) hypertension 08/16/2015    Past Medical History:  Diagnosis Date  . Hypertension   . Plantar fasciitis    both feet     Transfusion: none   Consultants (if any):   Discharged Condition: Improved  Hospital Course: JOSEPH JOHNS is an 66 y.o. female who was admitted 10/17/2016 with a diagnosis of right knee osteoarthritis and went to the operating room on 10/17/2016 and underwent the above named procedures.    Surgeries: Procedure(s): TOTAL KNEE ARTHROPLASTY on 10/17/2016 Patient tolerated the surgery well. Taken to PACU where she was stabilized and then transferred to the orthopedic floor.  Started on Lovenox 30 q 12 hrs. Foot pumps applied bilaterally at 80 mm. Heels elevated on bed with rolled towels. No evidence of DVT. Negative Homan. Physical therapy started on day #1 for gait training and transfer. OT started day #1 for ADL and assisted devices.  Patient's foley was d/c on day #1. Patient's IV  was d/c on day #2. On post op day 2 patient with hypokalemia, 2.9That improved to 4.1. Klor kon 40 meq TID ordered.   The patient became hypotensive with physical therapy on postop day 3. She was held overnight and was much improved. The patient had a bowel movement on postop day 1.  On post op day #4 patient was stable and ready for discharge to home with HHPT.  Implants: GMK 5 right femur with 4 right tibia with a 14 mm PS insert, 11 x 30 stem and 2 patella all components cemented  She was given perioperative antibiotics:  Anti-infectives    Start     Dose/Rate  Route Frequency Ordered Stop   10/17/16 2030  ceFAZolin (ANCEF) IVPB 2g/100 mL premix     2 g 200 mL/hr over 30 Minutes Intravenous Every 6 hours 10/17/16 1743 10/18/16 0256   10/17/16 1358  ceFAZolin (ANCEF) 2-4 GM/100ML-% IVPB    Comments:  Sylvester Harder   : cabinet override      10/17/16 1358 10/17/16 1424   10/17/16 0000  vancomycin (VANCOCIN) IVPB 1000 mg/200 mL premix     1,000 mg 200 mL/hr over 60 Minutes Intravenous  Once 10/16/16 2357 10/17/16 1424   10/17/16 0000  ceFAZolin (ANCEF) IVPB 2g/100 mL premix     2 g 200 mL/hr over 30 Minutes Intravenous  Once 10/16/16 2357 10/17/16 1439    .  She was given sequential compression devices, early ambulation, and Lvoenox for DVT prophylaxis.  She benefited maximally from the hospital stay and there were no complications.    Recent vital signs:  Vitals:   10/20/16 2220 10/21/16 0742  BP: (!) 129/49 124/65  Pulse: 75 69  Resp: 20 18  Temp: 98.7 F (37.1 C) 98.4 F (36.9 C)  SpO2: 95% 97%    Recent laboratory studies:  Lab Results  Component Value Date   HGB 11.6 (L) 10/19/2016   HGB 11.5 (L) 10/18/2016   HGB 12.1 10/17/2016   Lab Results  Component Value Date   WBC 10.1 10/19/2016   PLT 193 10/19/2016   Lab Results  Component Value Date   INR 0.97 10/11/2016  Lab Results  Component Value Date   NA 133 (L) 10/20/2016   K 3.8 10/20/2016   CL 100 (L) 10/20/2016   CO2 25 10/20/2016   BUN 12 10/20/2016   CREATININE 0.95 10/20/2016   GLUCOSE 118 (H) 10/20/2016    Discharge Medications:   Allergies as of 10/21/2016      Reactions   Oxycodone Other (See Comments)   Oxycodone causes hypotension and bradycardia   Septra [sulfamethoxazole-trimethoprim]    Complaints of GI burning, nausea, and headache.   Vancomycin Itching   Patient c/o itching to the scalp       Medication List    TAKE these medications   acetaminophen 500 MG tablet Commonly known as:  TYLENOL Take 2 tablets (1,000 mg total) by mouth  every 6 (six) hours as needed for mild pain (or Fever >/= 101). No more than 4000 mg of tylenol in 24 hr period   acetaminophen-codeine 120-12 MG/5ML solution Take 5 mLs by mouth every 6 (six) hours as needed for moderate pain.   enoxaparin 40 MG/0.4ML injection Commonly known as:  LOVENOX Inject 0.4 mLs (40 mg total) into the skin daily.   meloxicam 7.5 MG tablet Commonly known as:  MOBIC Take 1 tablet by mouth daily.   methocarbamol 500 MG tablet Commonly known as:  ROBAXIN Take 1 tablet (500 mg total) by mouth every 6 (six) hours as needed for muscle spasms.   olmesartan 40 MG tablet Commonly known as:  BENICAR Take 40 mg by mouth daily.   potassium chloride 10 MEQ tablet Commonly known as:  K-DUR,KLOR-CON Take 20 mEq by mouth daily.   triamterene-hydrochlorothiazide 37.5-25 MG capsule Commonly known as:  DYAZIDE Take 1 capsule by mouth daily.   Vitamin D (Ergocalciferol) 50000 units Caps capsule Commonly known as:  DRISDOL Take 50,000 Units by mouth every 7 (seven) days. [facility]            Durable Medical Equipment        Start     Ordered   10/17/16 1744  DME Walker rolling  Once    Question:  Patient needs a walker to treat with the following condition  Answer:  Status post total knee replacement, right   10/17/16 1743   10/17/16 1744  DME 3 n 1  Once     10/17/16 1743   10/17/16 1744  DME Bedside commode  Once    Question:  Patient needs a bedside commode to treat with the following condition  Answer:  Status post total right knee replacement   10/17/16 1743      Diagnostic Studies: Dg Knee 1-2 Views Right  Result Date: 10/17/2016 CLINICAL DATA:  Right knee replacement EXAM: RIGHT KNEE - 1-2 VIEW COMPARISON:  None. FINDINGS: The patient is status post right total knee arthroplasty. Femoral and tibial components are well seated. No periprosthetic fracture. There is expected postoperative gas. IMPRESSION: Expected postoperative appearance status post right  total knee arthroplasty. Electronically Signed   By: Ulyses Jarred M.D.   On: 10/17/2016 16:49    Disposition: Final discharge disposition not confirmed    Follow-up Information    Hessie Knows, MD. Go on 11/03/2016.   Specialty:  Orthopedic Surgery Why:  With [facility] @ 3:15PM Contact information: [place] [address] [phone]            SignedPrescott Parma, Saivion Goettel 10/21/2016, 7:49 AM

## 2016-10-19 NOTE — Discharge Instructions (Signed)

## 2016-10-19 NOTE — Progress Notes (Signed)
Physical Therapy Treatment Patient Details Name: Jaime Hawkins MRN: [id number] DOB: [birth year] Today's Date: 10/19/2016    History of Present Illness Pt is a 66 yo F with a diagnosis of primary OA of the R knee and is s/p elective R TKA.  Rapid response team called post op secondary to pt hypotensive with bradycardia after oxycodone.  BP improved. Only taking tylenol. Patient states she is sensitive to pain meds.    PT Comments    Pt agreeable to PT; reports pain in knee and back decreasing to 5/10 post medication and rest. Pt in general feels fatigued, sore with general malaise. Pt requires Min A supine to/from sit for lower extremities. Re educated on stretching with emphasis on slow, breathing controlled technique for pain/anxiety control. Min guard for sit to/from stand transfer and reinforced static stand with weight shift prior to gait. Pt notes lightheadedness; tolerates limited side stepping along bed with need for return to bed. Pt/spouse have questions regarding activity, positioning and cold therapy use at home. Questions answered to their satisfaction. Continue PT to progress endurance, strength, range to improve all functional mobility to allow for an optimal, safe return home.    Follow Up Recommendations  Home health PT     Equipment Recommendations  Rolling walker with 5" wheels    Recommendations for Other Services       Precautions / Restrictions Precautions Precautions: Fall;Knee Restrictions Weight Bearing Restrictions: Yes RLE Weight Bearing: Weight bearing as tolerated    Mobility  Bed Mobility Overal bed mobility: Needs Assistance Bed Mobility: Supine to Sit;Sit to Supine     Supine to sit: Min assist Sit to supine: Min assist   General bed mobility comments: assist LEs   Transfers Overall transfer level: Needs assistance Equipment used: Rolling walker (2 wheeled) Transfers: Sit to/from Stand Sit to Stand: Min guard         General  transfer comment: Cues for safe hand placement equal use of BLES. Educated in pre gait weight shift with QS  Ambulation/Gait Ambulation/Gait assistance: Min guard Ambulation Distance (Feet): 6 Feet Assistive device: Rolling walker (2 wheeled) Gait Pattern/deviations: Step-to pattern Gait velocity: decreased Gait velocity interpretation: <1.8 ft/sec, indicative of risk for recurrent falls General Gait Details: side step along edge of bed; lightheaded due to pain medicine. Sxs increase and unable to ambulate further   Stairs            Wheelchair Mobility    Modified Rankin (Stroke Patients Only)       Balance Overall balance assessment: Needs assistance Sitting-balance support: Feet supported;Bilateral upper extremity supported Sitting balance-Leahy Scale: Good     Standing balance support: Bilateral upper extremity supported;During functional activity Standing balance-Leahy Scale: Fair                              Cognition Arousal/Alertness: Awake/alert Behavior During Therapy: WFL for tasks assessed/performed Overall Cognitive Status: Within Functional Limits for tasks assessed                                        Exercises Total Joint Exercises Quad Sets: Strengthening;Both;20 reps;Supine (also 10 in stand) Knee Flexion: AROM;Right;10 reps;Seated (3 positions ea rep with 10 sec hold ea position) Goniometric ROM: 5-80 Other Exercises Other Exercises: Reducated on open versus closed knee position for swelling management per spouse questioning  for home positioning. Educated on use of cold therapy at home as well as activity level expectations and progression    General Comments        Pertinent Vitals/Pain Pain Assessment: 0-10 Pain Score: 5  Pain Location: R knee; back    Home Living                      Prior Function            PT Goals (current goals can now be found in the care plan section) Progress  towards PT goals: Progressing toward goals (slowly)    Frequency    BID      PT Plan Current plan remains appropriate    Co-evaluation              AM-PAC PT "6 Clicks" Daily Activity  Outcome Measure  Difficulty turning over in bed (including adjusting bedclothes, sheets and blankets)?: A Little Difficulty moving from lying on back to sitting on the side of the bed? : Total Difficulty sitting down on and standing up from a chair with arms (e.g., wheelchair, bedside commode, etc,.)?: Total Help needed moving to and from a bed to chair (including a wheelchair)?: A Little Help needed walking in hospital room?: A Little Help needed climbing 3-5 steps with a railing? : A Lot 6 Click Score: 13    End of Session Equipment Utilized During Treatment: Gait belt Activity Tolerance: Patient limited by pain;Patient tolerated treatment well Patient left: in bed;with call bell/phone within reach;with bed alarm set;with family/visitor present;with SCD's reapplied;Other (comment) (polar care in place)   PT Visit Diagnosis: Other abnormalities of gait and mobility (R26.89);Muscle weakness (generalized) (M62.81)     Time: 3748-2707 PT Time Calculation (min) (ACUTE ONLY): 35 min  Charges:  $Gait Training: 8-22 mins $Therapeutic Exercise: 8-22 mins                    G Codes:        Larae Grooms, PTA 10/19/2016, 3:55 PM

## 2016-10-19 NOTE — Evaluation (Signed)
Occupational Therapy Evaluation Patient Details Name: Jaime Hawkins MRN: [id number] DOB: July 08, 1950 Today's Date: 10/19/2016    History of Present Illness Pt is a 66 yo F with a diagnosis of primary OA of the R knee and is s/p elective R TKA.  Rapid response team called post op secondary to pt hypotensive with bradycardia after oxycodone.  BP improved. Only taking tylenol. Patient states she is sensitive to pain meds.   Clinical Impression   (Late entry documentation due to technical issues with documentation system.) Pt seen for OT evaluation this date. Pt lives with her spouse in a 2 story home but able to live on the main level while recovering. Pt was independent prior to surgery and eager to return to PLOF with less pain and get back to enjoying gardening. Pt presents with impairments in pain/strength/ROM/knowledge of AE/DME/increased need for assist for self care tasks and would benefit from skilled OT services while in the hospital to address noted impairments and functional deficits in LB bathing and dressing tasks including education/training in AE/DME, home/routines modifications, and falls prevention strategies in order to maximize return to PLOF.     Follow Up Recommendations  No OT follow up    Equipment Recommendations  Tub/shower bench;3 in 1 bedside commode    Recommendations for Other Services       Precautions / Restrictions Precautions Precautions: Fall;Knee Precaution Booklet Issued: No Restrictions Weight Bearing Restrictions: Yes RLE Weight Bearing: Weight bearing as tolerated Other Position/Activity Restrictions: Pt able to perform 10 Ind RLE SLRs without extension lag, no KI donned during session with RLE showing no signs of buckling.      Mobility Bed Mobility Overal bed mobility: Needs Assistance Bed Mobility: Supine to Sit;Sit to Supine     Supine to sit: Supervision;HOB elevated Sit to supine: Supervision   General bed mobility comments:  Increased time and effort but does not require any physical assist or cues.  Transfers Overall transfer level: Needs assistance Equipment used: Rolling walker (2 wheeled) Transfers: Sit to/from Stand Sit to Stand: Min guard              Balance Overall balance assessment: Needs assistance Sitting-balance support: No upper extremity supported;Feet supported Sitting balance-Leahy Scale: Good     Standing balance support: Bilateral upper extremity supported;During functional activity Standing balance-Leahy Scale: Fair                             ADL either performed or assessed with clinical judgement   ADL Overall ADL's : Needs assistance/impaired                                       General ADL Comments: pt generally min assist for LB bathing/dressing; pt/spouse educated in AE for LB ADL tasks, compression stocking mgt and pt/spouse verbalized understanding. Spouse confident he can provide needed level of assist at home.     Vision Baseline Vision/History: No visual deficits Patient Visual Report: No change from baseline       Perception     Praxis      Pertinent Vitals/Pain Pain Assessment: 0-10 Pain Score: 5  Pain Location: R knee Pain Descriptors / Indicators: Aching Pain Intervention(s): Limited activity within patient's tolerance;Monitored during session;Ice applied     Hand Dominance Right   Extremity/Trunk Assessment Upper Extremity Assessment Upper Extremity Assessment: Overall [facility]  for tasks assessed   Lower Extremity Assessment Lower Extremity Assessment: Defer to PT evaluation;Generalized weakness (as expected post op R TKA strength/ROM deficits)   Cervical / Trunk Assessment Cervical / Trunk Assessment: Normal   Communication Communication Communication: No difficulties   Cognition Arousal/Alertness: Awake/alert Behavior During Therapy: WFL for tasks assessed/performed Overall Cognitive Status: Within Functional  Limits for tasks assessed                                     General Comments       Exercises Other Exercises Other Exercises: pt/spouse educated in home/routines modifications and DME to support tub transfers and falls prevention strategies. Pt/spouse verbalized understanding.   Shoulder Instructions      Home Living Family/patient expects to be discharged to:: Private residence Living Arrangements: Spouse/significant other Available Help at Discharge: Family;Available 24 hours/day Type of Home: House Home Access: Ramped entrance     Home Layout: Two level;Able to live on main level with bedroom/bathroom     Bathroom Shower/Tub: Tub/shower unit;Walk-in shower (tub/shower downstairs, walk in shower upstairs)   Bathroom Toilet: Handicapped height (comfort height downstairs, standard upstairs)     Home Equipment: Walker - 4 wheels;Wheelchair - manual          Prior Functioning/Environment Level of Independence: Independent        Comments: Ind amb without AD limited community distances, Ind with ADLs, no fall history. Pt enjoys gardening/yardwork.        OT Problem List: Decreased strength;Decreased range of motion;Pain;Impaired balance (sitting and/or standing);Decreased knowledge of use of DME or AE      OT Treatment/Interventions: Self-care/ADL training;Therapeutic activities;Therapeutic exercise;Energy conservation;DME and/or AE instruction;Patient/family education    OT Goals(Current goals can be found in the care plan section) Acute Rehab OT Goals Patient Stated Goal: to feel better OT Goal Formulation: With patient/family Time For Goal Achievement: 11/02/16 Potential to Achieve Goals: Good  OT Frequency: Min 1X/week   Barriers to D/C:            Co-evaluation              AM-PAC PT "6 Clicks" Daily Activity     Outcome Measure Help from another person eating meals?: None Help from another person taking care of personal  grooming?: None Help from another person toileting, which includes using toliet, bedpan, or urinal?: A Little Help from another person bathing (including washing, rinsing, drying)?: A Little Help from another person to put on and taking off regular upper body clothing?: None Help from another person to put on and taking off regular lower body clothing?: A Little 6 Click Score: 21   End of Session Nurse Communication: Patient requests pain meds  Activity Tolerance: Patient tolerated treatment well Patient left: in bed;with call bell/phone within reach;with bed alarm set;with family/visitor present  OT Visit Diagnosis: Other abnormalities of gait and mobility (R26.89);Pain Pain - Right/Left: Right Pain - part of body: Knee                Time: 1450-1519 OT Time Calculation (min): 29 min Charges:  OT General Charges $OT Visit: 1 Procedure OT Evaluation $OT Eval Low Complexity: 1 Procedure OT Treatments $Self Care/Home Management : 8-22 mins G-Codes:     Jeni Salles, MPH, MS, OTR/L ascom [phone] 10/19/16, 8:33 AM

## 2016-10-19 NOTE — Progress Notes (Signed)
   Subjective: 2 Days Post-Op Procedure(s) (LRB): TOTAL KNEE ARTHROPLASTY (Right) Patient reports pain as mild to moderate. Mostly complaining of midline low back pain/spasms. Tylenol and robaxin helping. Requesting heating pad. Denies any CP, SOB, ABD pain. We will continue therapy today.    Objective: Vital signs in last 24 hours: Temp:  [98.4 F (36.9 C)-98.9 F (37.2 C)] 98.6 F (37 C) (08/09 0704) Pulse Rate:  [55-65] 65 (08/09 0704) Resp:  [16] 16 (08/09 0003) BP: (116-134)/(46-52) 116/52 (08/09 0704) SpO2:  [98 %-100 %] 100 % (08/09 0704) Weight:  [81.6 kg (180 lb)] 81.6 kg (180 lb) (08/08 1900)  Intake/Output from previous day: No intake/output data recorded. Intake/Output this shift: Total I/O In: -  Out: 575 [Urine:575]   Recent Labs  10/17/16 2043 10/18/16 0347 10/19/16 0321  HGB 12.1 11.5* 11.6*    Recent Labs  10/18/16 0347 10/19/16 0321  WBC 10.1 10.1  RBC 3.62* 3.73*  HCT 33.2* 33.6*  PLT 185 193    Recent Labs  10/18/16 0347 10/19/16 0321  NA 137 132*  K 3.1* 2.9*  CL 101 97*  CO2 29 27  BUN 16 12  CREATININE 1.10* 0.88  GLUCOSE 143* 170*  CALCIUM 8.3* 8.4*   No results for input(s): LABPT, INR in the last 72 hours.  EXAM General - Patient is Alert, Appropriate and Oriented Extremity - Neurovascular intact Sensation intact distally Intact pulses distally Dorsiflexion/Plantar flexion intact No cellulitis present Compartment soft Dressing - dressing C/D/I and no drainage. Dressing changed Motor Function - intact, moving foot and toes well on exam.   Past Medical History:  Diagnosis Date  . Hypertension   . Plantar fasciitis    both feet    Assessment/Plan:   2 Days Post-Op Procedure(s) (LRB): TOTAL KNEE ARTHROPLASTY (Right) Active Problems:   Localized osteoarthritis of right knee   hypokalemia  Estimated body mass index is 29.05 kg/m as calculated from the following:   Height as of this encounter: 5\' 6"  (1.676  m).   Weight as of this encounter: 81.6 kg (180 lb). Advance diet Up with therapy  Hypokalemia - k 2.9. Klor kon 40 mg TID. Recheck K this pm. Tylenol for pain, consider Tylenol #3  if needed. Patient requesting not to take other meds such as tramadol or oxycodone. Currently tolerating PT well and making good progress. CM to assist with discharge, home with HHPT tomorrow  DVT Prophylaxis - Lovenox, Foot Pumps and TED hose Weight-Bearing as tolerated to right leg   T. Rachelle Hora, PA-C [place] 10/19/2016, 10:15 AM

## 2016-10-19 NOTE — Progress Notes (Signed)
CH made a follow up visit with pt, met pt and pt's husband who was at bedside. [place] talked briefly with pt's husband who stated that pt was tired and needed to get some sleep at the time [facility] visited. Pt informed the pt's husband that he would do a follow up with pt as needed to provide pastoral care.   10/19/16 1100  Clinical Encounter Type  Visited With Patient and family together  Visit Type Initial;Follow-up  Referral From Chaplain  Consult/Referral To [place];Other (Comment)  Stress Factors  Patient Stress Factors Health changes  Family Stress Factors Health changes

## 2016-10-19 NOTE — Progress Notes (Signed)
Physical Therapy Treatment Patient Details Name: Jaime Hawkins MRN: [id number] DOB: 1950/05/17 Today's Date: 10/19/2016    History of Present Illness Pt is a 66 yo F with a diagnosis of primary OA of the R knee and is s/p elective R TKA.  Rapid response team called post op secondary to pt hypotensive with bradycardia after oxycodone.  BP improved. Only taking tylenol. Patient states she is sensitive to pain meds.    PT Comments    Pt agreeable to PT; reports 7/10 pain in R knee and 10/10 pain in back. Pt frustrated with back pain and feels it was due to bone foam use in bed. Pt educated on use of towel rolls for gravity assisted stretch and modified levers of stretch with varied placement of roll. Pt notes much improved comfort with use of towel rolls supine in bed post session. Pt educated on stretching techniques for R knee. Requires cues for proper transfer techniques and safety. Educated in pre gait quad sets with weight shift. Ambulation distance limited this session due to severe back pain. Pt returned to bed for rest/relief of back pain (pt also received muscle relaxer and hot pad) Plan to see pt this afternoon for continued progress of strength, endurance, safety and range to improve all functional mobility.     Follow Up Recommendations  Home health PT     Equipment Recommendations  Rolling walker with 5" wheels    Recommendations for Other Services       Precautions / Restrictions Precautions Precautions: Fall;Knee Restrictions Weight Bearing Restrictions: Yes RLE Weight Bearing: Weight bearing as tolerated    Mobility  Bed Mobility Overal bed mobility: Needs Assistance Bed Mobility: Sit to Supine       Sit to supine: Min assist   General bed mobility comments: assist LEs into bed  Transfers Overall transfer level: Needs assistance Equipment used: Rolling walker (2 wheeled) Transfers: Sit to/from Stand Sit to Stand: Min guard         General  transfer comment: Cues for safe hand placement equal use of BLES. Educated in pre gait weight shift with QS  Ambulation/Gait Ambulation/Gait assistance: Min guard Ambulation Distance (Feet): 6 Feet Assistive device: Rolling walker (2 wheeled) Gait Pattern/deviations: Step-to pattern Gait velocity: decreased Gait velocity interpretation: Below normal speed for age/gender General Gait Details: Decreased weight acceptance through RLE and decreased stride. Antalgic. Difficulty using weight transfer through UEs due to back pain   Stairs            Wheelchair Mobility    Modified Rankin (Stroke Patients Only)       Balance Overall balance assessment: Needs assistance Sitting-balance support: Feet supported;Bilateral upper extremity supported Sitting balance-Leahy Scale: Good     Standing balance support: Bilateral upper extremity supported;During functional activity Standing balance-Leahy Scale: Fair                              Cognition Arousal/Alertness: Awake/alert Behavior During Therapy: WFL for tasks assessed/performed Overall Cognitive Status: Within Functional Limits for tasks assessed                                        Exercises Total Joint Exercises Quad Sets: Strengthening;Both;20 reps;Supine (also 10 in stand) Knee Flexion: AROM;Right;10 reps;Seated (3 positions ea rep with 10 sec hold ea position) Goniometric ROM: 5-80 Other Exercises  Other Exercises: educated on open versus closed knee position for swelling management, as pt was found sitting with LEs dependent in 60 degree range bend    General Comments        Pertinent Vitals/Pain Pain Assessment: 0-10 Pain Score: 7  Pain Location: R knee; 10/10 in back    Home Living                      Prior Function            PT Goals (current goals can now be found in the care plan section) Progress towards PT goals: Progressing toward goals     Frequency    BID      PT Plan Current plan remains appropriate    Co-evaluation              AM-PAC PT "6 Clicks" Daily Activity  Outcome Measure  Difficulty turning over in bed (including adjusting bedclothes, sheets and blankets)?: A Little Difficulty moving from lying on back to sitting on the side of the bed? : Total Difficulty sitting down on and standing up from a chair with arms (e.g., wheelchair, bedside commode, etc,.)?: Total Help needed moving to and from a bed to chair (including a wheelchair)?: A Little Help needed walking in hospital room?: A Little Help needed climbing 3-5 steps with a railing? : A Lot 6 Click Score: 13    End of Session Equipment Utilized During Treatment: Gait belt Activity Tolerance: Patient limited by pain;Patient tolerated treatment well Patient left: in bed;with call bell/phone within reach;with bed alarm set;with family/visitor present;with SCD's reapplied;Other (comment) (polar care in place)   PT Visit Diagnosis: Other abnormalities of gait and mobility (R26.89);Muscle weakness (generalized) (M62.81)     Time: 6803-2122 PT Time Calculation (min) (ACUTE ONLY): 34 min  Charges:  $Gait Training: 8-22 mins $Therapeutic Exercise: 8-22 mins                    G Codes:        Larae Grooms, PTA 10/19/2016, 1:55 PM

## 2016-10-20 LAB — BASIC METABOLIC PANEL
ANION GAP: 8 (ref 5–15)
BUN: 12 mg/dL (ref 6–20)
CHLORIDE: 100 mmol/L — AB (ref 101–111)
CO2: 25 mmol/L (ref 22–32)
Calcium: 8.8 mg/dL — ABNORMAL LOW (ref 8.9–10.3)
Creatinine, Ser: 0.95 mg/dL (ref 0.44–1.00)
Glucose, Bld: 118 mg/dL — ABNORMAL HIGH (ref 65–99)
POTASSIUM: 3.8 mmol/L (ref 3.5–5.1)
SODIUM: 133 mmol/L — AB (ref 135–145)

## 2016-10-20 MED ORDER — ACETAMINOPHEN-CODEINE 120-12 MG/5ML PO SOLN
5.0000 mL | Freq: Four times a day (QID) | ORAL | 0 refills | Status: DC | PRN
Start: 2016-10-20 — End: 2017-08-20

## 2016-10-20 MED ORDER — SODIUM CHLORIDE 0.9 % IV BOLUS (SEPSIS)
500.0000 mL | Freq: Once | INTRAVENOUS | Status: AC
  Administered 2016-10-20: 500 mL via INTRAVENOUS

## 2016-10-20 NOTE — Progress Notes (Signed)
OT Cancellation Note  Patient Details Name: Jaime Hawkins MRN: [id number] DOB: 1950/09/26   Cancelled Treatment:    Reason Eval/Treat Not Completed: Medical issues which prohibited therapy (OT intervention was witheld in a.m. secondary low BP. Pt. seen in P.M., and it was determined that no further OT services are warranted at this time with pt., and husband. Pt. has prior knowledge of A/E use. Pt's husband plans to assist pt. at home.  )  Harrel Carina, MS, OTR/L 10/20/2016, 3:45 PM

## 2016-10-20 NOTE — Progress Notes (Signed)
BP rechecked after bolus 137/57.

## 2016-10-20 NOTE — Progress Notes (Signed)
Physical Therapy Treatment Patient Details Name: Jaime Hawkins MRN: [id number] DOB: Jul 28, 1950 Today's Date: 10/20/2016    History of Present Illness Pt is a 66 yo F with a diagnosis of primary OA of the R knee and is s/p elective R TKA.  Rapid response team called post op secondary to pt hypotensive with bradycardia after oxycodone.  BP improved. Only taking tylenol. Patient states she is sensitive to pain meds.    PT Comments    Pt feeling very poorly this AM, sitting at EOB on arrival but reporting being very lightheaded and off.  Her BP on arrival was 90s/50s, pt needed a lot of rest breaks/encouragement between reps and set and generally was very limited strength/mobiltiy wise.  After multiple times of pt having to lean over onto forearm to rest/recover the dynamap read BP at 69/28.  Got pt back to supine, nursing took BP manually and it did come up.  Tried to see pt again this AM but she was very tired and getting a bolus.  It appears pt is not going home this afternoon as planned, will try to see her later today.    Follow Up Recommendations   (per progress, medical)     Equipment Recommendations       Recommendations for Other Services       Precautions / Restrictions Precautions Precautions: Fall;Knee Restrictions Weight Bearing Restrictions: Yes RLE Weight Bearing: Weight bearing as tolerated    Mobility  Bed Mobility Overal bed mobility: Needs Assistance Bed Mobility: Sit to Supine       Sit to supine: Min assist   General bed mobility comments: Pt sitting on arrival, needed assist to get back to supine after feeling very dizzy  Transfers                    Ambulation/Gait                 Stairs            Wheelchair Mobility    Modified Rankin (Stroke Patients Only)       Balance Overall balance assessment: Needs assistance Sitting-balance support: Feet supported;Bilateral upper extremity supported Sitting  balance-Leahy Scale: Good Sitting balance - Comments: Multiple times while sitting EOB pt felt poorly enough to need to lean on forearm.  Pt generally light-headed and ill t/o session                                    Cognition Arousal/Alertness: Lethargic Behavior During Therapy: WFL for tasks assessed/performed Overall Cognitive Status: Within Functional Limits for tasks assessed                                        Exercises Total Joint Exercises Ankle Circles/Pumps: AROM;10 reps;Seated Heel Slides: AROM;Strengthening;10 reps;Seated Hip ABduction/ADduction: Strengthening;10 reps;Seated Long Arc Quad: AAROM;10 reps;Seated    General Comments        Pertinent Vitals/Pain Pain Assessment: 0-10 Pain Score: 3  Pain Location: R knee; back Pain Descriptors / Indicators:  (Pt feeling light headed and nauseated)    Home Living                      Prior Function            PT Goals (current  goals can now be found in the care plan section) Progress towards PT goals: Not progressing toward goals - comment (limited AM session today secondary to blood pressure issues)    Frequency    BID      PT Plan Current plan remains appropriate    Co-evaluation              AM-PAC PT "6 Clicks" Daily Activity  Outcome Measure  Difficulty turning over in bed (including adjusting bedclothes, sheets and blankets)?: A Little Difficulty moving from lying on back to sitting on the side of the bed? : A Lot Difficulty sitting down on and standing up from a chair with arms (e.g., wheelchair, bedside commode, etc,.)?: Total Help needed moving to and from a bed to chair (including a wheelchair)?: Total Help needed walking in hospital room?: Total Help needed climbing 3-5 steps with a railing? : Total 6 Click Score: 9    End of Session   Activity Tolerance: Patient limited by pain (pt with low BP and lightheadedness) Patient left: with bed  alarm set;with call bell/phone within reach;with nursing/sitter in room Nurse Communication:  (BP issue) PT Visit Diagnosis: Other abnormalities of gait and mobility (R26.89);Muscle weakness (generalized) (M62.81)     Time: 5361-4431 PT Time Calculation (min) (ACUTE ONLY): 29 min  Charges:  $Therapeutic Exercise: 8-22 mins $Therapeutic Activity: 8-22 mins                    G Codes:       Kreg Shropshire, DPT 10/20/2016, 1:00 PM

## 2016-10-20 NOTE — Care Management Note (Signed)
Case Management Note  Patient Details  Name: Jaime Hawkins MRN: [id number] Date of Birth: November 07, 1950  Subjective/Objective:  Discharging today. Faxed home health orders/face to face to [facility]. Attention: Diane. TC to [facility], notified Diane that patient was discharging. DME delivered.  Cost of Lovenox is $ 14.82. Patient updated.                 Action/Plan:   Expected Discharge Date:  10/20/16               Expected Discharge Plan:  [name]  In-House Referral:     Discharge planning Services  CM Consult  Post Acute Care Choice:  Durable Medical Equipment, Home Health Choice offered to:  Patient  DME Arranged:  Walker rolling DME Agency:  [place]:  PT [place]:  [place]  Status of Service:  Completed, signed off  If discussed at [place] of Stay Meetings, dates discussed:    Additional Comments:  Jolly Mango, RN 10/20/2016, 8:24 AM

## 2016-10-20 NOTE — Progress Notes (Signed)
   Subjective: 3 Days Post-Op Procedure(s) (LRB): TOTAL KNEE ARTHROPLASTY (Right) Patient reports pain as mild to moderate. Mostly complaining of midline low back pain/spasms. Tylenol and robaxin helping. Tylenol with Codeine helping.  Lower back is improving. Denies any CP, SOB, ABD pain. We will continue therapy today.    Objective: Vital signs in last 24 hours: Temp:  [97.1 F (36.2 C)-98.9 F (37.2 C)] 97.1 F (36.2 C) (08/10 0521) Pulse Rate:  [68-79] 79 (08/10 0521) Resp:  [16] 16 (08/10 0521) BP: (111-122)/(46-53) 111/53 (08/10 0521) SpO2:  [98 %-100 %] 100 % (08/10 0521)  Intake/Output from previous day: 08/09 0701 - 08/10 0700 In: -  Out: 2375 [Urine:2375] Intake/Output this shift: No intake/output data recorded.   Recent Labs  10/17/16 2043 10/18/16 0347 10/19/16 0321  HGB 12.1 11.5* 11.6*    Recent Labs  10/18/16 0347 10/19/16 0321  WBC 10.1 10.1  RBC 3.62* 3.73*  HCT 33.2* 33.6*  PLT 185 193    Recent Labs  10/18/16 0347 10/19/16 0321 10/19/16 1552  NA 137 132*  --   K 3.1* 2.9* 4.0  CL 101 97*  --   CO2 29 27  --   BUN 16 12  --   CREATININE 1.10* 0.88  --   GLUCOSE 143* 170*  --   CALCIUM 8.3* 8.4*  --    No results for input(s): LABPT, INR in the last 72 hours.  EXAM General - Patient is Alert, Appropriate and Oriented Extremity - Neurovascular intact Sensation intact distally Intact pulses distally Dorsiflexion/Plantar flexion intact No cellulitis present Compartment soft Dressing - dressing C/D/I and no drainage. Dressing Clean Motor Function - intact, moving foot and toes well on exam.   Past Medical History:  Diagnosis Date  . Hypertension   . Plantar fasciitis    both feet    Assessment/Plan:   3 Days Post-Op Procedure(s) (LRB): TOTAL KNEE ARTHROPLASTY (Right) Active Problems:   Localized osteoarthritis of right knee   hypokalemia  Estimated body mass index is 29.05 kg/m as calculated from the following:  Height as of this encounter: 5\' 6"  (1.676 m).   Weight as of this encounter: 81.6 kg (180 lb). Advance diet Up with therapy  Hypokalemia - k 2.9 has improved 4.1.. Klor kon 40 mg TID.  Tylenol #3  if needed. Patient requesting not to take other meds such as tramadol or oxycodone. Currently tolerating PT well and making good progress. CM to assist with discharge, home with HHPT today  DVT Prophylaxis - Lovenox, Foot Pumps and TED hose Weight-Bearing as tolerated to right leg   Reche Dixon, PA-C [place] 10/20/2016, 7:15 AM

## 2016-10-20 NOTE — Progress Notes (Signed)
Patient's BP 69/28 sitting, patient verbalizes feeling lightheaded. Patient was helped back in bed. BP rechecked manually 92/60. Patient verbalized feeling better in bed. Dr. Rudene Christians was made aware. Order received for a 500 ml bolus in 2 hrs.    Wynema Birch, RN

## 2016-10-21 NOTE — Progress Notes (Signed)
Physical Therapy Treatment Patient Details Name: Jaime Hawkins MRN: [id number] DOB: Jul 20, 1950 Today's Date: 10/21/2016    History of Present Illness Pt is a 66 yo F with a diagnosis of primary OA of the R knee and is s/p elective R TKA.  Rapid response team called post op secondary to pt hypotensive with bradycardia after oxycodone.  BP improved. Only taking tylenol. Patient states she is sensitive to pain meds.    PT Comments    Pt is up to walk with PT and noted BP's were all acceptable today.  However, pt is having some back pain with moist heat in place when PT entered.  Her husband was in attendance to visit to see how pt is going, and noted that he is comfortable with plan to assist her.  Answered all questions and completed treatment with information about stable BP's conveyed to her nurse.   Follow Up Recommendations  Home health PT     Equipment Recommendations  Rolling walker with 5" wheels    Recommendations for Other Services       Precautions / Restrictions Precautions Precautions: Fall;Knee Restrictions Weight Bearing Restrictions: Yes RLE Weight Bearing: Weight bearing as tolerated    Mobility  Bed Mobility Overal bed mobility: Needs Assistance Bed Mobility: Supine to Sit     Supine to sit: Min assist     General bed mobility comments: pt moved RLE and PT assisted her trunk  Transfers Overall transfer level: Needs assistance Equipment used: Rolling walker (2 wheeled) Transfers: Sit to/from Stand Sit to Stand: Min guard            Ambulation/Gait Ambulation/Gait assistance: Min guard Ambulation Distance (Feet): 80 Feet (40 x 2) Assistive device: Rolling walker (2 wheeled) Gait Pattern/deviations: Step-to pattern Gait velocity: decreased Gait velocity interpretation: Below normal speed for age/gender General Gait Details: Pt was in pain in back and could not take a longer trip, agreed to two shorter walks   Stairs Stairs:  (does  not need)          Wheelchair Mobility    Modified Rankin (Stroke Patients Only)       Balance Overall balance assessment: Needs assistance Sitting-balance support: Feet supported;Bilateral upper extremity supported Sitting balance-Leahy Scale: Good     Standing balance support: Bilateral upper extremity supported;During functional activity Standing balance-Leahy Scale: Fair                              Cognition Arousal/Alertness: Awake/alert Behavior During Therapy: WFL for tasks assessed/performed Overall Cognitive Status: Within Functional Limits for tasks assessed                                        Exercises      General Comments General comments (skin integrity, edema, etc.): BP checks supine were 140/58, sitting 130/53 and standing was 112/56.  Pulses were all in 70's.      Pertinent Vitals/Pain Pain Assessment: Faces Faces Pain Scale: Hurts even more Pain Location: back Pain Descriptors / Indicators: Sore Pain Intervention(s): Monitored during session;Premedicated before session;Repositioned    Home Living                      Prior Function            PT Goals (current goals can now be found in  the care plan section) Acute Rehab PT Goals Patient Stated Goal: to feel better PT Goal Formulation: With patient/family Progress towards PT goals: Progressing toward goals    Frequency    BID      PT Plan Current plan remains appropriate    Co-evaluation              AM-PAC PT "6 Clicks" Daily Activity  Outcome Measure  Difficulty turning over in bed (including adjusting bedclothes, sheets and blankets)?: Total Difficulty moving from lying on back to sitting on the side of the bed? : Total Difficulty sitting down on and standing up from a chair with arms (e.g., wheelchair, bedside commode, etc,.)?: Total Help needed moving to and from a bed to chair (including a wheelchair)?: A Little Help needed  walking in hospital room?: A Little Help needed climbing 3-5 steps with a railing? : A Little 6 Click Score: 12    End of Session Equipment Utilized During Treatment: Gait belt Activity Tolerance: Patient limited by pain Patient left: with call bell/phone within reach;in bed;with family/visitor present (sitting side of bed) Nurse Communication: Mobility status PT Visit Diagnosis: Other abnormalities of gait and mobility (R26.89);Muscle weakness (generalized) (M62.81)     Time: 0354-6568 PT Time Calculation (min) (ACUTE ONLY): 26 min  Charges:  $Gait Training: 8-22 mins $Therapeutic Activity: 8-22 mins                    G Codes:  Functional Assessment Tool Used: AM-PAC 6 Clicks Basic Mobility     Ramond Dial 10/21/2016, 11:44 AM   Mee Hives, PT MS Acute Rehab Dept. Number: [place] and [place]

## 2016-10-21 NOTE — Progress Notes (Signed)
   Subjective: 4 Days Post-Op Procedure(s) (LRB): TOTAL KNEE ARTHROPLASTY (Right) Patient reports pain as mild to moderate.  Tylenol and robaxin helping. Tylenol with Codeine helping.  Lower back is improving. Denies any CP, SOB, ABD pain. Hypotensive with physical therapy yesterday. Held discharge. We will continue therapy today.    Objective: Vital signs in last 24 hours: Temp:  [98.2 F (36.8 C)-99.1 F (37.3 C)] 98.4 F (36.9 C) (08/11 0742) Pulse Rate:  [69-85] 69 (08/11 0742) Resp:  [18-20] 18 (08/11 0742) BP: (92-136)/(48-65) 124/65 (08/11 0742) SpO2:  [95 %-100 %] 97 % (08/11 0742)  Intake/Output from previous day: No intake/output data recorded. Intake/Output this shift: No intake/output data recorded.   Recent Labs  10/19/16 0321  HGB 11.6*    Recent Labs  10/19/16 0321  WBC 10.1  RBC 3.73*  HCT 33.6*  PLT 193    Recent Labs  10/19/16 0321 10/19/16 1552 10/20/16 0444  NA 132*  --  133*  K 2.9* 4.0 3.8  CL 97*  --  100*  CO2 27  --  25  BUN 12  --  12  CREATININE 0.88  --  0.95  GLUCOSE 170*  --  118*  CALCIUM 8.4*  --  8.8*   No results for input(s): LABPT, INR in the last 72 hours.  EXAM General - Patient is Alert, Appropriate and Oriented Extremity - Neurovascular intact Sensation intact distally Intact pulses distally Dorsiflexion/Plantar flexion intact No cellulitis present Compartment soft Dressing - dressing C/D/I and no drainage. Dressing Clean Motor Function - intact, moving foot and toes well on exam.   Past Medical History:  Diagnosis Date  . Hypertension   . Plantar fasciitis    both feet    Assessment/Plan:   4 Days Post-Op Procedure(s) (LRB): TOTAL KNEE ARTHROPLASTY (Right) Active Problems:   Localized osteoarthritis of right knee   hypokalemia  Estimated body mass index is 29.05 kg/m as calculated from the following:   Height as of this encounter: 5\' 6"  (1.676 m).   Weight as of this encounter: 81.6 kg (180  lb). Advance diet Up with therapy   Tylenol #3  if needed. Patient requesting not to take other meds such as tramadol or oxycodone.  Physical therapy today. Watch blood pressure. CM to assist with discharge, home with HHPT today if able to ambulate with therapy.  DVT Prophylaxis - Lovenox, Foot Pumps and TED hose Weight-Bearing as tolerated to right leg   Reche Dixon, PA-C [place] 10/21/2016, 7:47 AM

## 2016-10-21 NOTE — Care Management Note (Addendum)
Case Management Note  Patient Details  Name: Jaime Hawkins MRN: [id number] Date of Birth: [birth year]  Subjective/Objective:        Home with HH-PT with [facility]. Referral was faxed to [facility].           Action/Plan:   Expected Discharge Date:  10/21/16               Expected Discharge Plan:  [place]  In-House Referral:     Discharge planning Services  CM Consult  Post Acute Care Choice:  Durable Medical Equipment, Home Health Choice offered to:  Patient  DME Arranged:  Walker rolling, Bedside commode DME Agency:  [place]:  PT [place]:  [place]  Status of Service:  Completed, signed off  If discussed at Start of Stay Meetings, dates discussed:    Additional Comments:  Aronda Burford A, RN 10/21/2016, 9:20 AM

## 2016-10-21 NOTE — Progress Notes (Signed)
Patient is being discharged to home with [facility]. Husband bedside and taking pt home. IV removed, DC and RX instructions given and patient acknowledged understanding. Extra dressing given to patient. Patient did declined changing dressing because she said someone changed it last night. Dressing is clean, dry and intact.

## 2016-10-21 NOTE — Progress Notes (Signed)
Pt alert and oriented. Blood pressure remaing WDL this shift. Still refusing bone foam. Up to bathroom without difficulty. Surgical dressing dry and intact this shift.

## 2016-11-28 ENCOUNTER — Ambulatory Visit
Admission: RE | Admit: 2016-11-28 | Discharge: 2016-11-28 | Disposition: A | Discharge status: Home / Self Care | Payer: Federal, State, Local not specified - PPO | Source: Ambulatory Visit | Attending: Family Medicine | Admitting: Family Medicine

## 2016-11-28 DIAGNOSIS — Z1231 Encounter for screening mammogram for malignant neoplasm of breast: Secondary | ICD-10-CM | POA: Diagnosis present

## 2017-08-20 ENCOUNTER — Ambulatory Visit: Payer: Federal, State, Local not specified - PPO | Admitting: Podiatry

## 2017-08-20 ENCOUNTER — Encounter: Payer: Self-pay | Admitting: Podiatry

## 2017-08-20 DIAGNOSIS — M722 Plantar fascial fibromatosis: Secondary | ICD-10-CM | POA: Diagnosis not present

## 2017-08-20 NOTE — Progress Notes (Signed)
She presents today after having not seen her for over 2 years with a complaint of a flareup of plantar fasciitis once again.  States the medial longitudinal arch is painful as a pulling sensation.  Objective: Vital signs are stable alert and oriented x3 pulses are strong and palpable.  Neurologic sensorium is intact.  Deep tendon reflexes are intact.  Muscle strength is normal symmetrical.  She has pain on palpation medial calcaneal tubercles bilateral.  Assessment: Probable plantar fasciitis recurrence bilateral.  Plan: Discussed etiology pathology conservative surgical therapies.  After sterile Betadine skin prep I injected 20 mg Kenalog 5 mg Marcaine to the point of maximal tenderness of bilateral heels.  Follow-up with me on an as-needed basis.

## 2017-10-25 ENCOUNTER — Other Ambulatory Visit: Payer: Self-pay | Admitting: Family Medicine

## 2018-02-27 ENCOUNTER — Other Ambulatory Visit: Payer: Self-pay | Admitting: Family Medicine

## 2018-02-27 DIAGNOSIS — Z1231 Encounter for screening mammogram for malignant neoplasm of breast: Secondary | ICD-10-CM

## 2018-03-25 ENCOUNTER — Ambulatory Visit
Admission: RE | Admit: 2018-03-25 | Discharge: 2018-03-25 | Disposition: A | Discharge status: Home / Self Care | Payer: Federal, State, Local not specified - PPO | Source: Ambulatory Visit | Attending: Family Medicine | Admitting: Family Medicine

## 2018-03-25 DIAGNOSIS — Z1231 Encounter for screening mammogram for malignant neoplasm of breast: Secondary | ICD-10-CM | POA: Diagnosis present

## 2018-08-07 ENCOUNTER — Ambulatory Visit: Payer: Federal, State, Local not specified - PPO | Admitting: Podiatry

## 2018-08-07 ENCOUNTER — Encounter: Payer: Self-pay | Admitting: Podiatry

## 2018-08-07 ENCOUNTER — Other Ambulatory Visit: Payer: Self-pay

## 2018-08-07 VITALS — Temp 98.6°F

## 2018-08-07 DIAGNOSIS — M722 Plantar fascial fibromatosis: Secondary | ICD-10-CM | POA: Diagnosis not present

## 2018-08-07 NOTE — Progress Notes (Signed)
She presents today for follow-up of bilateral plantar fasciitis states that I am just really here to get my injections at the beginning of the summer like I do every year.  States that it really just never resolves I had orthotics in the past but I found them to be too hard.  Objective: Vital signs are stable alert and oriented x3.  Pulses are palpable.  Has pain on palpation medial calcaneal tubercles bilateral.  Assessment: Pain in limb secondary to plantar fasciitis bilateral.  Plan: Reinjected bilateral heels today 20 mg Kenalog 5 mg Marcaine point of maximal tenderness bilateral.

## 2018-10-21 ENCOUNTER — Ambulatory Visit: Payer: Self-pay | Admitting: Urology

## 2018-12-23 ENCOUNTER — Other Ambulatory Visit: Payer: Self-pay | Admitting: Orthopedic Surgery

## 2018-12-23 DIAGNOSIS — M76891 Other specified enthesopathies of right lower limb, excluding foot: Secondary | ICD-10-CM

## 2019-01-02 ENCOUNTER — Other Ambulatory Visit: Payer: Self-pay

## 2019-01-02 ENCOUNTER — Ambulatory Visit
Admission: RE | Admit: 2019-01-02 | Discharge: 2019-01-02 | Disposition: A | Discharge status: Home / Self Care | Payer: Federal, State, Local not specified - PPO | Source: Ambulatory Visit | Attending: Orthopedic Surgery | Admitting: Orthopedic Surgery

## 2019-01-02 DIAGNOSIS — M76891 Other specified enthesopathies of right lower limb, excluding foot: Secondary | ICD-10-CM | POA: Diagnosis present

## 2019-01-20 ENCOUNTER — Ambulatory Visit (INDEPENDENT_AMBULATORY_CARE_PROVIDER_SITE_OTHER): Payer: Federal, State, Local not specified - PPO | Admitting: Urology

## 2019-01-20 ENCOUNTER — Other Ambulatory Visit: Payer: Self-pay

## 2019-01-20 ENCOUNTER — Encounter: Payer: Self-pay | Admitting: Urology

## 2019-01-20 VITALS — BP 163/75 | HR 78 | Ht 66.0 in | Wt 195.0 lb

## 2019-01-20 DIAGNOSIS — R319 Hematuria, unspecified: Secondary | ICD-10-CM

## 2019-01-20 LAB — URINALYSIS, COMPLETE
Bilirubin, UA: NEGATIVE
Glucose, UA: NEGATIVE
Ketones, UA: NEGATIVE
Leukocytes,UA: NEGATIVE
Nitrite, UA: NEGATIVE
Protein,UA: NEGATIVE
RBC, UA: NEGATIVE
Specific Gravity, UA: 1.02 (ref 1.005–1.030)
Urobilinogen, Ur: 0.2 mg/dL (ref 0.2–1.0)
pH, UA: 8 — ABNORMAL HIGH (ref 5.0–7.5)

## 2019-01-20 LAB — MICROSCOPIC EXAMINATION
Bacteria, UA: NONE SEEN
RBC: NONE SEEN /hpf (ref 0–2)

## 2019-01-20 NOTE — Patient Instructions (Signed)
Cystoscopy Cystoscopy is a procedure that is used to help diagnose and sometimes treat conditions that affect the lower urinary tract. The lower urinary tract includes the bladder and the urethra. The urethra is the tube that drains urine from the bladder. Cystoscopy is done using a thin, tube-shaped instrument with a light and camera at the end (cystoscope). The cystoscope may be hard or flexible, depending on the goal of the procedure. The cystoscope is inserted through the urethra, into the bladder. Cystoscopy may be recommended if you have:  Urinary tract infections that keep coming back.  Blood in the urine (hematuria).  An inability to control when you urinate (urinary incontinence) or an overactive bladder.  Unusual cells found in a urine sample.  A blockage in the urethra, such as a urinary stone.  Painful urination.  An abnormality in the bladder found during an intravenous pyelogram (IVP) or CT scan. Cystoscopy may also be done to remove a sample of tissue to be examined under a microscope (biopsy). Tell a health care provider about:  Any allergies you have.  All medicines you are taking, including vitamins, herbs, eye drops, creams, and over-the-counter medicines.  Any problems you or family members have had with anesthetic medicines.  Any blood disorders you have.  Any surgeries you have had.  Any medical conditions you have.  Whether you are pregnant or may be pregnant. What are the risks? Generally, this is a safe procedure. However, problems may occur, including:  Infection.  Bleeding.  Allergic reactions to medicines.  Damage to other structures or organs. What happens before the procedure?  Ask your health care provider about: ? Changing or stopping your regular medicines. This is especially important if you are taking diabetes medicines or blood thinners. ? Taking medicines such as aspirin and ibuprofen. These medicines can thin your blood. Do not take  these medicines unless your health care provider tells you to take them. ? Taking over-the-counter medicines, vitamins, herbs, and supplements.  Follow instructions from your health care provider about eating or drinking restrictions.  Ask your health care provider what steps will be taken to help prevent infection. These may include: ? Washing skin with a germ-killing soap. ? Taking antibiotic medicine.  You may have an exam or testing, such as: ? X-rays of the bladder, urethra, or kidneys. ? Urine tests to check for signs of infection.  Plan to have someone take you home from the hospital or clinic. What happens during the procedure?   You will be given one or more of the following: ? A medicine to help you relax (sedative). ? A medicine to numb the area (local anesthetic).  The area around the opening of your urethra will be cleaned.  The cystoscope will be passed through your urethra into your bladder.  Germ-free (sterile) fluid will flow through the cystoscope to fill your bladder. The fluid will stretch your bladder so that your health care provider can clearly examine your bladder walls.  Your doctor will look at the urethra and bladder. Your doctor may take a biopsy or remove stones.  The cystoscope will be removed, and your bladder will be emptied. The procedure may vary among health care providers and hospitals. What can I expect after the procedure? After the procedure, it is common to have:  Some soreness or pain in your abdomen and urethra.  Urinary symptoms. These include: ? Mild pain or burning when you urinate. Pain should stop within a few minutes after you urinate. This   may last for up to 1 week. ? A small amount of blood in your urine for several days. ? Feeling like you need to urinate but producing only a small amount of urine. Follow these instructions at home: Medicines  Take over-the-counter and prescription medicines only as told by your health care  provider.  If you were prescribed an antibiotic medicine, take it as told by your health care provider. Do not stop taking the antibiotic even if you start to feel better. General instructions  Return to your normal activities as told by your health care provider. Ask your health care provider what activities are safe for you.  Do not drive for 24 hours if you were given a sedative during your procedure.  Watch for any blood in your urine. If the amount of blood in your urine increases, call your health care provider.  Follow instructions from your health care provider about eating or drinking restrictions.  If a tissue sample was removed for testing (biopsy) during your procedure, it is up to you to get your test results. Ask your health care provider, or the department that is doing the test, when your results will be ready.  Drink enough fluid to keep your urine pale yellow.  Keep all follow-up visits as told by your health care provider. This is important. Contact a health care provider if you:  Have pain that gets worse or does not get better with medicine, especially pain when you urinate.  Have trouble urinating.  Have more blood in your urine. Get help right away if you:  Have blood clots in your urine.  Have abdominal pain.  Have a fever or chills.  Are unable to urinate. Summary  Cystoscopy is a procedure that is used to help diagnose and sometimes treat conditions that affect the lower urinary tract.  Cystoscopy is done using a thin, tube-shaped instrument with a light and camera at the end.  After the procedure, it is common to have some soreness or pain in your abdomen and urethra.  Watch for any blood in your urine. If the amount of blood in your urine increases, call your health care provider.  If you were prescribed an antibiotic medicine, take it as told by your health care provider. Do not stop taking the antibiotic even if you start to feel better. This  information is not intended to replace advice given to you by your health care provider. Make sure you discuss any questions you have with your health care provider. Document Released: 02/25/2000 Document Revised: 02/19/2018 Document Reviewed: 02/19/2018 Elsevier Patient Education  2020 [employer]  

## 2019-01-20 NOTE — Addendum Note (Signed)
Addended by: Verlene Mayer A on: 01/20/2019 02:34 PM   Modules accepted: Orders

## 2019-01-20 NOTE — Progress Notes (Signed)
01/20/2019 11:10 AM   Jaime Hawkins 1950-11-20 [id number]  Referring provider: Maryland Pink, MD [address] [facility] [place],  [place] [address]  No chief complaint on file.   HPI: I was consulted to assess the patient who had possibly some blood in the urine in June.  She saw a few red spots on her underclothes and in the commode for 3 or 4 days but no other symptoms.  She has no smoking history and does not take daily aspirin or blood thinners.  She voids every 2-3 hours and gets up twice a night and has no incontinence  She has a nonspecific right sided discomfort or twinge.  She has a hip tendon issue with recent MRI  She denies history of kidney stones previously surgery and bladder infections.  She has not had a hysterectomy  Modifying factors: There are no other modifying factors  Associated signs and symptoms: There are no other associated signs and symptoms Aggravating and relieving factors: There are no other aggravating or relieving factors Severity: Moderate Duration: Persistent   PMH: Past Medical History:  Diagnosis Date  . Hypertension   . Plantar fasciitis    both feet    Surgical History: Past Surgical History:  Procedure Laterality Date  . APPENDECTOMY    . KNEE ARTHROSCOPY Right   . TONSILLECTOMY    . 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   Vancomycin Itching   Patient c/o itching to the scalp       Medication List       Accurate as of January 20, 2019 11:10 AM. If you have any questions, ask your nurse or doctor.        acetaminophen 500 MG tablet  Commonly known as: TYLENOL Take 2 tablets (1,000 mg total) by mouth every 6 (six) hours as needed for mild pain (or Fever >/= 101). No more than 4000 mg of tylenol in 24 hr period   fluticasone 50 MCG/ACT nasal spray Commonly known as: FLONASE Place into the nose.   Klor-Con 10 10 MEQ tablet Generic drug: potassium chloride Take by mouth.   meloxicam 7.5 MG tablet Commonly known as: MOBIC Take 1 tablet by mouth daily.   methocarbamol 500 MG tablet Commonly known as: ROBAXIN Take 1 tablet (500 mg total) by mouth every 6 (six) hours as needed for muscle spasms.   olmesartan 40 MG tablet Commonly known as: BENICAR Take 40 mg by mouth daily.   telmisartan 80 MG tablet Commonly known as: MICARDIS Take 80 mg by mouth daily.   triamterene-hydrochlorothiazide 37.5-25 MG capsule Commonly known as: DYAZIDE Take 1 capsule by mouth daily.   valACYclovir 500 MG tablet Commonly known as: VALTREX Take by mouth.   Vitamin D (Ergocalciferol) 1.25 MG (50000 UT) Caps capsule Commonly known as: DRISDOL Take 50,000 Units by mouth every 7 (seven) days. FRIDAYS       Allergies:  Allergies  Allergen Reactions  . Oxycodone Other (See Comments)    Oxycodone causes hypotension and bradycardia  . Septra [Sulfamethoxazole-Trimethoprim]     Complaints of GI burning, nausea, and headache.  . Vancomycin  Itching    Patient c/o itching to the scalp     Family History: Family History  Problem Relation Age of Onset  . Breast cancer Mother 21  . Breast cancer Maternal Aunt 63    Social History:  reports that she has never smoked. She has never used smokeless tobacco. She reports that she does not drink alcohol or use drugs.  ROS:                                        Physical Exam: There were no vitals taken for this visit.  Constitutional:  Alert and oriented, No acute distress. HEENT: [place] AT, moist mucus membranes.  Trachea midline, no masses.  Cardiovascular: No clubbing, cyanosis, or edema. Respiratory: Normal respiratory effort, no increased work of breathing. GI: Abdomen is soft, nontender, nondistended, no abdominal masses GU: No CVA tenderness.  No bladder tenderness Skin: No rashes, bruises or suspicious lesions. Lymph: No cervical or inguinal adenopathy. Neurologic: Grossly intact, no focal deficits, moving all 4 extremities. Psychiatric: Normal mood and affect.  Laboratory Data: Lab Results  Component Value Date   WBC 10.1 10/19/2016   HGB 11.6 (L) 10/19/2016   HCT 33.6 (L) 10/19/2016   MCV 90.1 10/19/2016   PLT 193 10/19/2016    Lab Results  Component Value Date   CREATININE 0.95 10/20/2016    No results found for: PSA  No results found for: TESTOSTERONE  No results found for: HGBA1C  Urinalysis    Component Value Date/Time   COLORURINE STRAW (A) 10/11/2016 0819   APPEARANCEUR CLEAR (A) 10/11/2016 0819   LABSPEC 1.008 10/11/2016 0819   PHURINE 6.0 10/11/2016 [place] 10/11/2016 0819   HGBUR NEGATIVE 10/11/2016 [place] 10/11/2016 [place] 10/11/2016 0819   PROTEINUR NEGATIVE 10/11/2016 0819   NITRITE NEGATIVE 10/11/2016 0819   LEUKOCYTESUR NEGATIVE 10/11/2016 0819    Pertinent Imaging:   Assessment & Plan:   She is currently on Keflex twice a day patient did not have microscopic hematuria.  For safety reasons usual hematuria work-up offered.  Urinalysis negative today.  I will call if CT scan abnormal.  She will come back for cystoscopy.   There are no diagnoses linked to this encounter.  No follow-ups on file.  Reece Packer, MD  [facility] [address], [place], [place] [address] [phone]

## 2019-02-03 ENCOUNTER — Telehealth: Payer: Self-pay | Admitting: Urology

## 2019-02-03 ENCOUNTER — Ambulatory Visit
Admission: RE | Admit: 2019-02-03 | Discharge: 2019-02-03 | Disposition: A | Discharge status: Home / Self Care | Payer: Federal, State, Local not specified - PPO | Source: Ambulatory Visit | Attending: Urology | Admitting: Urology

## 2019-02-03 ENCOUNTER — Other Ambulatory Visit: Payer: Self-pay

## 2019-02-03 DIAGNOSIS — R319 Hematuria, unspecified: Secondary | ICD-10-CM | POA: Insufficient documentation

## 2019-02-03 LAB — POCT I-STAT CREATININE: Creatinine, Ser: 1.1 mg/dL — ABNORMAL HIGH (ref 0.44–1.00)

## 2019-02-03 MED ORDER — IOHEXOL 300 MG/ML  SOLN
125.0000 mL | Freq: Once | INTRAMUSCULAR | Status: AC | PRN
  Administered 2019-02-03: 125 mL via INTRAVENOUS

## 2019-02-03 NOTE — Telephone Encounter (Signed)
Pt called stating she just left having her CT and said she was told to call as soon as she left from getting her CT.  Advised pt that Dr. Matilde Sprang is in clinic. Please advise of results.   Thank you

## 2019-02-10 NOTE — Telephone Encounter (Signed)
Spoke with patient and notified her that per Dr. Mikle Bosworth last note she needs a cysto apt for work up of hematuria. Apt was made

## 2019-02-10 NOTE — Telephone Encounter (Signed)
Patient and her husband called the office requesting a call back from Dr. Matilde Sprang with CT results.

## 2019-02-10 NOTE — Telephone Encounter (Signed)
CT scan normal Patient wants to know

## 2019-02-10 NOTE — Telephone Encounter (Signed)
Pt LMOM and I called her back and I read her the note from [place]di[place]id, she voiced understanding. She asked if she still needs to have another test, but wasn't sure of the name of the test. She requests a call back to discuss.

## 2019-02-14 ENCOUNTER — Other Ambulatory Visit: Payer: Self-pay | Admitting: Family Medicine

## 2019-02-14 DIAGNOSIS — Z1231 Encounter for screening mammogram for malignant neoplasm of breast: Secondary | ICD-10-CM

## 2019-03-03 ENCOUNTER — Ambulatory Visit: Payer: Federal, State, Local not specified - PPO | Admitting: Urology

## 2019-03-03 ENCOUNTER — Encounter: Payer: Self-pay | Admitting: Urology

## 2019-03-03 ENCOUNTER — Other Ambulatory Visit: Payer: Self-pay

## 2019-03-03 VITALS — BP 132/88 | HR 86 | Ht 66.0 in | Wt 195.0 lb

## 2019-03-03 DIAGNOSIS — R3129 Other microscopic hematuria: Secondary | ICD-10-CM | POA: Diagnosis not present

## 2019-03-03 LAB — URINALYSIS, COMPLETE
Bilirubin, UA: NEGATIVE
Glucose, UA: NEGATIVE
Ketones, UA: NEGATIVE
Leukocytes,UA: NEGATIVE
Nitrite, UA: NEGATIVE
Protein,UA: NEGATIVE
RBC, UA: NEGATIVE
Specific Gravity, UA: 1.025 (ref 1.005–1.030)
Urobilinogen, Ur: 0.2 mg/dL (ref 0.2–1.0)
pH, UA: 6 (ref 5.0–7.5)

## 2019-03-03 LAB — MICROSCOPIC EXAMINATION: RBC: NONE SEEN /hpf (ref 0–2)

## 2019-03-03 NOTE — Addendum Note (Signed)
Addended by: Verlene Mayer A on: 03/03/2019 10:19 AM   Modules accepted: Orders

## 2019-03-03 NOTE — Progress Notes (Signed)
03/03/2019 9:40 AM   Jaime Hawkins 1950-05-21 [id number]  Referring provider: Maryland Pink, MD [address] [facility] [place],  [place] [address]  Chief Complaint  Patient presents with  . Cysto    HPI: I was consulted to assess the patient who had possibly some blood in the urine in June.  She saw a few red spots on her underclothes and in the commode for 3 or 4 days but no other symptoms.  She has no smoking history and does not take daily aspirin or blood thinners.  She voids every 2-3 hours and gets up twice a night and has no incontinence  She is currently on Keflex twice a day patient did not have microscopic hematuria.  For safety reasons usual hematuria work-up offered.  Urinalysis negative today.  I will call if CT scan abnormal.  She will come back for cystoscopy.   Today Frequency stable.  No blood in urine.  CT scan normal  Cystoscopy: Patient underwent flexible cystoscopy utilizing sterile technique.  Bladder mucosa and trigone were normal.  No carcinoma.  No foreign body.  No cystitis.  Tolerated well   PMH: Past Medical History:  Diagnosis Date  . Hypertension   . Plantar fasciitis    both feet    Surgical History: Past Surgical History:  Procedure Laterality Date  . APPENDECTOMY    . KNEE ARTHROSCOPY Right   . TONSILLECTOMY    . 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   Vancomycin Itching   Patient c/o itching to the scalp       Medication List       Accurate as of March 03, 2019  9:40 AM. If you have any questions, ask  your nurse or doctor.        acetaminophen 500 MG tablet Commonly known as: TYLENOL Take 2 tablets (1,000 mg total) by mouth every 6 (six) hours as needed for mild pain (or Fever >/= 101). No more than 4000 mg of tylenol in 24 hr period   fluticasone 50 MCG/ACT nasal spray Commonly known as: FLONASE Place into the nose.   Klor-Con 10 10 MEQ tablet Generic drug: potassium chloride Take by mouth.   meloxicam 7.5 MG tablet Commonly known as: MOBIC Take 1 tablet by mouth daily.   methocarbamol 500 MG tablet Commonly known as: ROBAXIN Take 1 tablet (500 mg total) by mouth every 6 (six) hours as needed for muscle spasms.   olmesartan 40 MG tablet Commonly known as: BENICAR Take 40 mg by mouth daily.   telmisartan 80 MG tablet Commonly known as: MICARDIS Take 80 mg by mouth daily.   triamterene-hydrochlorothiazide 37.5-25 MG capsule Commonly known as: DYAZIDE Take 1 capsule by mouth daily.   valACYclovir 500 MG tablet Commonly known as: VALTREX Take by mouth.   Vitamin D (Ergocalciferol) 1.25 MG (50000 UT) Caps capsule Commonly known as: DRISDOL Take 50,000 Units by mouth every 7 (seven) days. FRIDAYS       Allergies:  Allergies  Allergen Reactions  . Olmesartan Itching    GENERIC  . Oxycodone  Other (See Comments)    Oxycodone causes hypotension and bradycardia  . Septra [Sulfamethoxazole-Trimethoprim]     Complaints of GI burning, nausea, and headache.  . Vancomycin Itching    Patient c/o itching to the scalp     Family History: Family History  Problem Relation Age of Onset  . Breast cancer Mother 47  . Breast cancer Maternal Aunt 63    Social History:  reports that she has never smoked. She has never used smokeless tobacco. She reports that she does not drink alcohol or use drugs.  ROS:                                        Physical Exam: BP 132/88   Pulse 86   Ht 5\' 6"  (1.676 m)   Wt 88.5 kg   BMI 31.47 kg/m     Constitutional:  Alert and oriented, No acute distress.  Laboratory Data: Lab Results  Component Value Date   WBC 10.1 10/19/2016   HGB 11.6 (L) 10/19/2016   HCT 33.6 (L) 10/19/2016   MCV 90.1 10/19/2016   PLT 193 10/19/2016    Lab Results  Component Value Date   CREATININE 1.10 (H) 02/03/2019    No results found for: PSA  No results found for: TESTOSTERONE  No results found for: HGBA1C  Urinalysis    Component Value Date/Time   COLORURINE STRAW (A) 10/11/2016 0819   APPEARANCEUR Clear 01/20/2019 1120   LABSPEC 1.008 10/11/2016 0819   PHURINE 6.0 10/11/2016 0819   GLUCOSEU Negative 01/20/2019 1120   HGBUR NEGATIVE 10/11/2016 0819   BILIRUBINUR Negative 01/20/2019 1120   KETONESUR NEGATIVE 10/11/2016 0819   PROTEINUR Negative 01/20/2019 1120   PROTEINUR NEGATIVE 10/11/2016 0819   NITRITE Negative 01/20/2019 1120   NITRITE NEGATIVE 10/11/2016 0819   LEUKOCYTESUR Negative 01/20/2019 1120    Pertinent Imaging:   Assessment & Plan: See patient as needed  There are no diagnoses linked to this encounter.  No follow-ups on file.  Reece Packer, MD  [facility] [address] [place], [place] [address] [phone]

## 2019-03-31 ENCOUNTER — Ambulatory Visit
Admission: RE | Admit: 2019-03-31 | Discharge: 2019-03-31 | Disposition: A | Discharge status: Home / Self Care | Payer: Federal, State, Local not specified - PPO | Source: Ambulatory Visit | Attending: Family Medicine | Admitting: Family Medicine

## 2019-03-31 DIAGNOSIS — Z1231 Encounter for screening mammogram for malignant neoplasm of breast: Secondary | ICD-10-CM | POA: Diagnosis present

## 2019-07-02 ENCOUNTER — Other Ambulatory Visit: Payer: Self-pay

## 2019-07-02 ENCOUNTER — Ambulatory Visit: Payer: Medicare Other | Admitting: Podiatry

## 2019-07-02 ENCOUNTER — Encounter: Payer: Self-pay | Admitting: Podiatry

## 2019-07-02 DIAGNOSIS — M722 Plantar fascial fibromatosis: Secondary | ICD-10-CM | POA: Diagnosis not present

## 2019-07-02 NOTE — Progress Notes (Signed)
She presents today after having not seen her since May of last year with a chief complaint of bilateral heel pain.  States that my feet of been bothering me for the past few weeks again.  She says the shots really helped so thus go ahead and do another round of shots if we can.  Objective: Vital signs are stable alert oriented x3.  There is no erythema edema cellulitis drainage or odor she has pain on palpation medial calcaneal tubercles bilaterally.  Assessment: Plantar fasciitis bilateral.  Plan: With recurrence of this plantar fasciitis on ahead and reinjected the bilateral heels 20 mg Kenalog 5 mg Marcaine point of maximal tenderness.  Recommended appropriate shoe gear stretching exercise ice therapy shoe gear modifications I will follow-up with her on an as-needed basis.

## 2019-10-22 ENCOUNTER — Encounter: Payer: Self-pay | Admitting: Podiatry

## 2019-10-22 ENCOUNTER — Other Ambulatory Visit: Payer: Self-pay

## 2019-10-22 ENCOUNTER — Ambulatory Visit: Payer: Federal, State, Local not specified - PPO | Admitting: Podiatry

## 2019-10-22 DIAGNOSIS — M722 Plantar fascial fibromatosis: Secondary | ICD-10-CM

## 2019-10-22 MED ORDER — METHYLPREDNISOLONE 4 MG PO TBPK: ORAL_TABLET | ORAL | 0 refills | Status: DC

## 2019-10-22 NOTE — Progress Notes (Signed)
She presents today for follow-up of bilateral plantar fasciitis.  She states that they were doing better but now there is not.  Objective: Vital signs are stable alert oriented x3 there is no erythema edema cellulitis drainage or odor she has pain on palpation medial calcaneal tubercles bilaterally.  She also has pain along the medial band of plantar fascia medial longitudinal arch bilaterally.  Assessment: Pain in limb secondary to plantar fasciitis chronic in nature.  Plan: At this point I recommended another round of injection 20 mg Kenalog 5 mg Marcaine and also recommended physical therapy.

## 2019-12-17 ENCOUNTER — Ambulatory Visit: Payer: Federal, State, Local not specified - PPO | Admitting: Dermatology

## 2019-12-17 ENCOUNTER — Other Ambulatory Visit: Payer: Self-pay

## 2019-12-17 DIAGNOSIS — L578 Other skin changes due to chronic exposure to nonionizing radiation: Secondary | ICD-10-CM | POA: Diagnosis not present

## 2019-12-17 DIAGNOSIS — D692 Other nonthrombocytopenic purpura: Secondary | ICD-10-CM

## 2019-12-17 DIAGNOSIS — L814 Other melanin hyperpigmentation: Secondary | ICD-10-CM

## 2019-12-17 DIAGNOSIS — L821 Other seborrheic keratosis: Secondary | ICD-10-CM | POA: Diagnosis not present

## 2019-12-17 DIAGNOSIS — D040 Carcinoma in situ of skin of lip: Secondary | ICD-10-CM

## 2019-12-17 DIAGNOSIS — D489 Neoplasm of uncertain behavior, unspecified: Secondary | ICD-10-CM

## 2019-12-17 DIAGNOSIS — L820 Inflamed seborrheic keratosis: Secondary | ICD-10-CM

## 2019-12-17 DIAGNOSIS — C4492 Squamous cell carcinoma of skin, unspecified: Secondary | ICD-10-CM

## 2019-12-17 HISTORY — DX: Squamous cell carcinoma of skin, unspecified: C44.92

## 2019-12-17 NOTE — Patient Instructions (Addendum)
Seborrheic Keratosis  What causes seborrheic keratoses? Seborrheic keratoses are harmless, common skin growths that first appear during adult life.  As time goes by, more growths appear.  Some people may develop a large number of them.  Seborrheic keratoses appear on both covered and uncovered body parts.  They are not caused by sunlight.  The tendency to develop seborrheic keratoses can be inherited.  They vary in color from skin-colored to gray, brown, or even black.  They can be either smooth or have a rough, warty surface.   Seborrheic keratoses are superficial and look as if they were stuck on the skin.  Under the microscope this type of keratosis looks like layers upon layers of skin.  That is why at times the top layer may seem to fall off, but the rest of the growth remains and re-grows.    Treatment Seborrheic keratoses do not need to be treated, but can easily be removed in the office.  Seborrheic keratoses often cause symptoms when they rub on clothing or jewelry.  Lesions can be in the way of shaving.  If they become inflamed, they can cause itching, soreness, or burning.  Removal of a seborrheic keratosis can be accomplished by freezing, burning, or surgery. If any spot bleeds, scabs, or grows rapidly, please return to have it checked, as these can be an indication of a skin cancer.   Cryotherapy Aftercare  . Wash gently with soap and water everyday.   Apply Vaseline and Band-Aid daily until healed.   Wound Care Instructions  1. Cleanse wound gently with soap and water once a day then pat dry with clean gauze. Apply a thing coat of Petrolatum (petroleum jelly, "Vaseline") over the wound (unless you have an allergy to this). We recommend that you use a new, sterile tube of Vaseline. Do not pick or remove scabs. Do not remove the yellow or white "healing tissue" from the base of the wound.  2. You should call the office for your biopsy report after 1 week if you have not already been  contacted.  3. If you experience any problems, such as abnormal amounts of bleeding, swelling, significant bruising, significant pain, or evidence of infection, please call the office immediately.

## 2019-12-17 NOTE — Progress Notes (Signed)
Follow-Up Visit   Subjective  Jaime Hawkins is a 69 y.o. female who presents for the following: spots (arms, hands - some a little itchy.). She has a red spot on her upper lip that has been treated with LN2 in the past several times.  It has not cleared up and stays pink.  Sometimes it gets scaly.  She has a brown spot on her nose she would like removed.    The following portions of the chart were reviewed this encounter and updated as appropriate:      Review of Systems:  No other skin or systemic complaints except as noted in HPI or Assessment and Plan.  Objective  Well appearing patient in no apparent distress; mood and affect are within normal limits.  A focused examination was performed including arms, hands. Relevant physical exam findings are noted in the Assessment and Plan.  Objective  Left Lower Shoulder x 1, Right Wrist x 2 (3): Erythematous keratotic or waxy stuck-on papule  Objective  Left Nasal Tip: 1.0 cm light tan macule  Images    Objective  Left Medial Upper Lip at [place]: 4x3 mm blanching pink macule      Assessment & Plan  Inflamed seborrheic keratosis (3) Left Lower Shoulder x 1, Right Wrist x 2  Destruction of lesion - Left Lower Shoulder x 1, Right Wrist x 2  Destruction method: cryotherapy   Informed consent: discussed and consent obtained   Lesion destroyed using liquid nitrogen: Yes   Region frozen until ice ball extended beyond lesion: Yes   Outcome: patient tolerated procedure well with no complications   Post-procedure details: wound care instructions given    Lentigines Left Nasal Tip  Lentigo vs Flat SK Cryotherapy x 1  Discussed cosmetic LN2 treatment, $60 for the first lesion and $15 for each additional if treated on the same day.  Includes one touch up no charge  Destruction of lesion - Left Nasal Tip  Destruction method: cryotherapy   Informed consent: discussed and consent obtained   Lesion destroyed  using liquid nitrogen: Yes   Region frozen until ice ball extended beyond lesion: Yes   Outcome: patient tolerated procedure well with no complications   Post-procedure details: wound care instructions given    Neoplasm of uncertain behavior Left Medial Upper Lip at [facility]  Skin / nail biopsy Type of biopsy: tangential   Informed consent: discussed and consent obtained   Patient was prepped and draped in usual sterile fashion: Area prepped with alcohol. Anesthesia: the lesion was anesthetized in a standard fashion   Anesthetic:  1% lidocaine w/ epinephrine 1-100,000 buffered w/ 8.4% NaHCO3 Instrument used: flexible razor blade   Hemostasis achieved with: pressure, aluminum chloride and electrodesiccation   Outcome: patient tolerated procedure well   Post-procedure details: wound care instructions given   Post-procedure details comment:  Ointment and small bandage applied  Specimen 1 - Surgical pathology Differential Diagnosis: AK r/o SCC/BCC Check Margins: No 4x3 mm blanching pink macule  Has been treated in the past with LN2 treatment with no improvement. Shave biopsy today, AK r/o BCC/SCC Discussed resulting scar  Seborrheic Keratoses - Stuck-on, waxy, tan-brown papules and plaques  - Discussed benign etiology and prognosis. - Observe - Call for any changes  Actinic Damage - diffuse scaly erythematous macules with underlying dyspigmentation - Recommend daily broad spectrum sunscreen SPF 30+ to sun-exposed areas, reapply every 2 hours as needed.  - Call for new or changing lesions.  Purpura - Violaceous  macules and patches - Benign - Related to age, sun damage and/or use of blood thinners - Observe - Can use OTC arnica containing moisturizer such as Dermend Bruise Formula if desired - Call for worsening or other concerns   Return pending biopsy.  IJamesetta Hawkins, CMA, am acting as scribe for Brendolyn Patty, MD .  Documentation: I have reviewed the above  documentation for accuracy and completeness, and I agree with the above.  Brendolyn Patty MD

## 2019-12-22 ENCOUNTER — Telehealth: Payer: Self-pay

## 2019-12-22 MED ORDER — FLUOROURACIL 5 % EX CREA: TOPICAL_CREAM | Freq: Two times a day (BID) | CUTANEOUS | 0 refills | Status: AC

## 2019-12-22 NOTE — Telephone Encounter (Signed)
Advised pt of bx results.  Advised pt in 2 wks to start Efudex to aa lip bid for 4 weeks as tolerated, sent to [facility].  Scheduled pt for 4 wk f/u./sh

## 2019-12-22 NOTE — Telephone Encounter (Signed)
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  I would recommend using 5FU cream bid to aa upper lip x 4 weeks as tolerated.  It will scab and get raw and sore.  She can use vaseline on it if needed.  I would let the biopsy site heal first (~2 weeks), and then start the cream.  I can check her after about 2-3 weeks of using it, so schedule a f/up about 4 weeks from now.

## 2019-12-29 ENCOUNTER — Telehealth: Payer: Self-pay

## 2019-12-29 NOTE — Telephone Encounter (Signed)
Pt left voicemail requesting to speak with you regarding her SCC in situ diagnosis. Pt call back number - [phone].

## 2019-12-29 NOTE — Telephone Encounter (Signed)
Patient called with questions about skin biopsy of her upper lip. Advised patient the biopsy was a thin skin cancer, SCC in situ. She will start 5FU Cream to treat this area in a couple of days. She should keep her follow-up appointment scheduled 01/19/20 at 2:30pm and Dr Nicole Kindred will re-evaluate this area and answer any further questions she has.

## 2020-01-19 ENCOUNTER — Ambulatory Visit: Payer: Federal, State, Local not specified - PPO | Admitting: Dermatology

## 2020-01-19 ENCOUNTER — Other Ambulatory Visit: Payer: Self-pay

## 2020-01-19 DIAGNOSIS — D040 Carcinoma in situ of skin of lip: Secondary | ICD-10-CM

## 2020-01-19 DIAGNOSIS — L578 Other skin changes due to chronic exposure to nonionizing radiation: Secondary | ICD-10-CM | POA: Diagnosis not present

## 2020-01-19 DIAGNOSIS — D099 Carcinoma in situ, unspecified: Secondary | ICD-10-CM

## 2020-01-19 MED ORDER — HYDROCORTISONE 2.5 % EX OINT: TOPICAL_OINTMENT | CUTANEOUS | 0 refills | Status: AC

## 2020-01-19 NOTE — Progress Notes (Signed)
   Follow-Up Visit   Subjective  Jaime Hawkins is a 69 y.o. female who presents for the following: SCC in situ (left medial upper lip at vermillion). She has been using 5FU cream twice a day for almost 3 weeks (Wednesday will be 3 weeks). Area and surrounding areas are red, scaly, and painful.    The following portions of the chart were reviewed this encounter and updated as appropriate:      Review of Systems:  No other skin or systemic complaints except as noted in HPI or Assessment and Plan.  Objective  Well appearing patient in no apparent distress; mood and affect are within normal limits.  A focused examination was performed including face. Relevant physical exam findings are noted in the Assessment and Plan.  Objective  Left Medial Upper Lip at [place]: Erythema with extensive crusting on entire upper lip and left lower lip.   Assessment & Plan   Actinic Damage - chronic, secondary to cumulative UV radiation exposure/sun exposure over time - diffuse scaly erythematous macules with underlying dyspigmentation - Recommend daily broad spectrum sunscreen SPF 30+ to sun-exposed areas, reapply every 2 hours as needed.  - Call for new or changing lesions.  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 Start 2.5% hydrocortisone ointment Apply to lips qd/bid prn.  Will recheck lip when healed and assess if further treatment is needed  Return in about 1 month (around 02/18/2020) for f/u SCC.   [name], CMA, am acting as scribe for Brendolyn Patty, MD .  Documentation: I have reviewed the above documentation for accuracy and completeness, and I agree with the above.  Brendolyn Patty MD

## 2020-01-19 NOTE — Patient Instructions (Addendum)
Stop using 5% Fluorouracil Cream on upper lip. Continue using Vaseline as needed during the day. Start 2.5% hydrocortisone ointment 1-2 times a day as needed until healed.

## 2020-01-27 ENCOUNTER — Other Ambulatory Visit: Payer: Self-pay | Admitting: Dermatology

## 2020-01-27 ENCOUNTER — Other Ambulatory Visit: Payer: Self-pay

## 2020-01-27 ENCOUNTER — Ambulatory Visit: Payer: Federal, State, Local not specified - PPO | Admitting: Dermatology

## 2020-01-27 DIAGNOSIS — D099 Carcinoma in situ, unspecified: Secondary | ICD-10-CM

## 2020-01-27 DIAGNOSIS — D040 Carcinoma in situ of skin of lip: Secondary | ICD-10-CM

## 2020-01-27 MED ORDER — MUPIROCIN 2 % EX OINT: 1.0000 "application " | TOPICAL_OINTMENT | Freq: Every day | CUTANEOUS | 0 refills | Status: AC

## 2020-01-27 MED ORDER — CEFDINIR 300 MG PO CAPS: 300.0000 mg | ORAL_CAPSULE | Freq: Two times a day (BID) | ORAL | 0 refills | Status: AC

## 2020-01-27 NOTE — Progress Notes (Signed)
   Follow-Up Visit   Subjective  Jaime Hawkins is a 69 y.o. female who presents for the following: Skin Problem (Patient has bx proven SCCis at left medial upper lip at vermillion that she was treating with 5FU/calcipotriene for just under 3 weeks. She came in the office last week for follow up and since then has started using HC 2.5% ointment and Vaseline. Patient advises lips have gotten worse. ).  They started looking better before they got worse a few days ago.    The following portions of the chart were reviewed this encounter and updated as appropriate:      Review of Systems:  No other skin or systemic complaints except as noted in HPI or Assessment and Plan.  Objective  Well appearing patient in no apparent distress; mood and affect are within normal limits.  A focused examination was performed including face. Relevant physical exam findings are noted in the Assessment and Plan.  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  Start mupirocin ointment bid. May continue HC 2.5% ointment bid prn itch/burning. Recommend cool compresses bid. Bacterial culture done today.  Return as scheduled.  [name], RMA, am acting as scribe for Brendolyn Patty, MD . Documentation: I have reviewed the above documentation for accuracy and completeness, and I agree with the above.  Brendolyn Patty MD

## 2020-02-03 LAB — ANAEROBIC AND AEROBIC CULTURE

## 2020-02-09 ENCOUNTER — Telehealth: Payer: Self-pay

## 2020-02-09 MED ORDER — LEVOFLOXACIN 500 MG PO TABS: 500.0000 mg | ORAL_TABLET | Freq: Every day | ORAL | 0 refills | Status: AC

## 2020-02-09 NOTE — Telephone Encounter (Signed)
-----   Message from Brendolyn Patty, MD sent at 02/09/2020  1:36 PM EST ----- Please ask how she is doing with current treatment reigmen. Positive culture for staph aureus (sensitive to [facility] which pt is taking) Also positive for stenotrophomonas maltophilia (gram neg).  This is not sensitive to [facility].  I would recommend switching the oral abx to Bactrim DS twice daily x 1 week (if not allergic) as it covers both of the bacteria. There is also scant yeast detected, but I don't feel like that is clinically significant.

## 2020-02-09 NOTE — Telephone Encounter (Signed)
Advised pt of culture results.  She said her face had improved no open areas, no itching or burning, only redness.  Advised pt if she didn't have any open areas she could d/c the mupirocin oint and if she didn't have any itching or burning she could d/c the HC 2.5% ointment.  Pt has finished the [place].  Advised pt we would send in Levaquin 500mg  1 po qd for 7 days to treat the gram negative aspect of the positive culture.

## 2020-02-16 ENCOUNTER — Ambulatory Visit: Payer: Federal, State, Local not specified - PPO | Admitting: Dermatology

## 2020-02-18 ENCOUNTER — Other Ambulatory Visit: Payer: Self-pay

## 2020-02-18 ENCOUNTER — Ambulatory Visit: Payer: Federal, State, Local not specified - PPO | Admitting: Dermatology

## 2020-02-18 DIAGNOSIS — L821 Other seborrheic keratosis: Secondary | ICD-10-CM

## 2020-02-18 DIAGNOSIS — L089 Local infection of the skin and subcutaneous tissue, unspecified: Secondary | ICD-10-CM

## 2020-02-18 DIAGNOSIS — D229 Melanocytic nevi, unspecified: Secondary | ICD-10-CM

## 2020-02-18 DIAGNOSIS — S01501A Unspecified open wound of lip, initial encounter: Secondary | ICD-10-CM | POA: Diagnosis not present

## 2020-02-18 DIAGNOSIS — T148XXA Other injury of unspecified body region, initial encounter: Secondary | ICD-10-CM

## 2020-02-18 DIAGNOSIS — D220 Melanocytic nevi of lip: Secondary | ICD-10-CM

## 2020-02-18 DIAGNOSIS — Z86007 Personal history of in-situ neoplasm of skin: Secondary | ICD-10-CM

## 2020-02-18 DIAGNOSIS — Z872 Personal history of diseases of the skin and subcutaneous tissue: Secondary | ICD-10-CM

## 2020-02-18 NOTE — Patient Instructions (Signed)
Recommend daily broad spectrum sunscreen SPF 30+ to sun-exposed areas, reapply every 2 hours as needed. Call for new or changing lesions.  

## 2020-02-18 NOTE — Progress Notes (Signed)
   Follow-Up Visit   Subjective  Jaime Hawkins is a 69 y.o. female who presents for the following: Wound Check (3 week recheck. Hx of staph infection secondary to 5FU/Calcipotriene Tx for SCCis of left upper lip.).  She is doing much better after taking oral antibiotics    The following portions of the chart were reviewed this encounter and updated as appropriate:     Review of Systems: No other skin or systemic complaints except as noted in HPI or Assessment and Plan.  Objective  Well appearing patient in no apparent distress; mood and affect are within normal limits.  A focused examination was performed including face, right hand. Relevant physical exam findings are noted in the Assessment and Plan.  Objective  Left Upper [place] Lip: Mild erythema at site of infection  Objective  Left Upper [place] Lip: Well healed scar with no evidence of recurrence.   Objective  Left Upper Cutaneous Lip below nare: 0.5cm pink flesh papule  Objective  Right Dorsal Hand: Stuck-on, waxy, tan-brown papule or plaque --Discussed benign etiology and prognosis.   Assessment & Plan  Wound infection Left Upper [place] Lip  Hx of staph impetigo and gm neg infection secondary to fFU/Calcipotriene Tx for SCCis- resolved with oral antibiotics (Omnicef and Levaquin)  Observe.   History of squamous cell carcinoma in situ (SCCIS) of skin Left Upper [place] Lip  Clear. Observe for recurrence. Call clinic for new or changing lesions.  Recommend regular skin exams, daily broad-spectrum spf 30+ sunscreen use, and photoprotection.      Nevus Left Upper Cutaneous Lip below nare  Benign-appearing.  Observation.  Call clinic for new or changing moles.  Recommend daily use of broad spectrum spf 30+ sunscreen to sun-exposed areas.    Seborrheic keratosis Right Dorsal Hand  Reassured benign age-related growth.  Recommend observation.  Discussed cryotherapy if spot(s) become  irritated or inflamed.   Return in about 3 months (around 05/18/2020) for [facility] recheck.   I, Emelia Salisbury, CMA, am acting as scribe for Jaime Patty, MD.  Documentation: I have reviewed the above documentation for accuracy and completeness, and I agree with the above.  Jaime Patty MD

## 2020-03-09 ENCOUNTER — Other Ambulatory Visit: Payer: Self-pay | Admitting: Family Medicine

## 2020-03-09 DIAGNOSIS — Z1231 Encounter for screening mammogram for malignant neoplasm of breast: Secondary | ICD-10-CM

## 2020-04-01 ENCOUNTER — Ambulatory Visit
Admission: RE | Admit: 2020-04-01 | Discharge: 2020-04-01 | Disposition: A | Discharge status: Home / Self Care | Payer: Federal, State, Local not specified - PPO | Source: Ambulatory Visit | Attending: Family Medicine | Admitting: Family Medicine

## 2020-04-01 ENCOUNTER — Other Ambulatory Visit: Payer: Self-pay

## 2020-04-01 DIAGNOSIS — Z1231 Encounter for screening mammogram for malignant neoplasm of breast: Secondary | ICD-10-CM | POA: Diagnosis not present

## 2020-05-24 ENCOUNTER — Ambulatory Visit: Payer: Federal, State, Local not specified - PPO | Admitting: Dermatology

## 2020-05-24 ENCOUNTER — Other Ambulatory Visit: Payer: Self-pay

## 2020-05-24 DIAGNOSIS — R238 Other skin changes: Secondary | ICD-10-CM

## 2020-05-24 DIAGNOSIS — L570 Actinic keratosis: Secondary | ICD-10-CM

## 2020-05-24 DIAGNOSIS — K130 Diseases of lips: Secondary | ICD-10-CM

## 2020-05-24 DIAGNOSIS — L820 Inflamed seborrheic keratosis: Secondary | ICD-10-CM | POA: Diagnosis not present

## 2020-05-24 DIAGNOSIS — L578 Other skin changes due to chronic exposure to nonionizing radiation: Secondary | ICD-10-CM | POA: Diagnosis not present

## 2020-05-24 DIAGNOSIS — Z86007 Personal history of in-situ neoplasm of skin: Secondary | ICD-10-CM

## 2020-05-24 DIAGNOSIS — L821 Other seborrheic keratosis: Secondary | ICD-10-CM

## 2020-05-24 MED ORDER — MUPIROCIN 2 % EX OINT: 1.0000 "application " | TOPICAL_OINTMENT | CUTANEOUS | 0 refills | Status: AC

## 2020-05-24 NOTE — Progress Notes (Signed)
Follow-Up Visit   Subjective  Jaime Hawkins is a 70 y.o. female who presents for the following: hx of SCCIS (L med upper [place] lip at [place], 54m f/u) and check spots (Forehead, R hand/wrist, irritating).  Patient accompanied by husband who contributes to history.  The following portions of the chart were reviewed this encounter and updated as appropriate:       Review of Systems:  No other skin or systemic complaints except as noted in HPI or Assessment and Plan.  Objective  Well appearing patient in no apparent distress; mood and affect are within normal limits.  A focused examination was performed including face. Relevant physical exam findings are noted in the Assessment and Plan.  Objective  Left med upper lip at [place]mil[place]: Well healed scar with no evidence of recurrence.    Objective  Mid Lower [place] Lip: Purple macule  Objective  Left lower [place] Lip: Clear today  Objective  Left Forehead x 4, R lat eyebrow x 1 (5): Pink scaly macules   Objective  Right Wrist/hand x 4, L malar cheek x 1 (5): Erythematous keratotic or waxy stuck-on papule    Assessment & Plan    Actinic Damage - chronic, secondary to cumulative UV radiation exposure/sun exposure over time - diffuse scaly erythematous macules with underlying dyspigmentation - Recommend daily broad spectrum sunscreen SPF 30+ to sun-exposed areas, reapply every 2 hours as needed.  - Recommend staying in the shade or wearing long sleeves, sun glasses (UVA+UVB protection) and wide brim hats (4-inch brim around the entire circumference of the hat). - Call for new or changing lesions.  Seborrheic Keratoses - Stuck-on, waxy, tan-brown papules and plaques  - Discussed benign etiology and prognosis. - Observe - Call for any changes   History of squamous cell carcinoma in situ (SCCIS) of skin Left med upper lip at [place]  Clear. Observe for recurrence. Call clinic for new or  changing lesions.  Recommend regular skin exams, daily broad-spectrum spf 30+ sunscreen use, and photoprotection.     Venous [place] Mid Lower [place] Lip  Benign, observe.    Cheilitis Left lower [place] Lip  Post 5FU treatment  Hx of irritated sensation and periodic crusting lower lip, clear today Cont Vaseline prn Start Mupirocin oint qd/bid x 1wk After 1 week restart HC 2.5% oint bid x 1wk  mupirocin ointment (BACTROBAN) 2 % - Left lower [place] Lip  AK (actinic keratosis) (5) Left Forehead x 4, R lat eyebrow x 1  Destruction of lesion - Left Forehead x 4, R lat eyebrow x 1  Destruction method: cryotherapy   Informed consent: discussed and consent obtained   Lesion destroyed using liquid nitrogen: Yes   Region frozen until ice ball extended beyond lesion: Yes   Outcome: patient tolerated procedure well with no complications   Post-procedure details: wound care instructions given    Inflamed seborrheic keratosis (5) Right Wrist/hand x 4, L malar cheek x 1  Destruction of lesion - Right Wrist/hand x 4, L malar cheek x 1  Destruction method: cryotherapy   Informed consent: discussed and consent obtained   Lesion destroyed using liquid nitrogen: Yes   Region frozen until ice ball extended beyond lesion: Yes   Outcome: patient tolerated procedure well with no complications   Post-procedure details: wound care instructions given    Return in about 6 months (around 11/24/2020) for recheck AKs, SCCIS.  I, Othelia Pulling, RMA, am acting as scribe for Brendolyn Patty, MD .  Documentation:  I have reviewed the above documentation for accuracy and completeness, and I agree with the above.  Brendolyn Patty MD

## 2020-05-24 NOTE — Patient Instructions (Signed)
Mupirocin ointment use 2 times a day for 1 week to lip After 1 week start Hydrocortisone 2.5% oint 2 times a day for 1-2 weeks

## 2020-10-20 ENCOUNTER — Ambulatory Visit: Payer: Federal, State, Local not specified - PPO | Admitting: Dermatology

## 2020-11-30 ENCOUNTER — Ambulatory Visit: Payer: Federal, State, Local not specified - PPO | Admitting: Dermatology

## 2020-12-06 ENCOUNTER — Ambulatory Visit: Payer: Medicare Other | Admitting: Podiatry

## 2021-04-06 ENCOUNTER — Other Ambulatory Visit: Payer: Self-pay | Admitting: Family Medicine

## 2021-04-06 DIAGNOSIS — Z1231 Encounter for screening mammogram for malignant neoplasm of breast: Secondary | ICD-10-CM

## 2021-05-11 ENCOUNTER — Ambulatory Visit
Admission: RE | Admit: 2021-05-11 | Discharge: 2021-05-11 | Disposition: A | Discharge status: Home / Self Care | Payer: Federal, State, Local not specified - PPO | Source: Ambulatory Visit | Attending: Family Medicine | Admitting: Family Medicine

## 2021-05-11 ENCOUNTER — Other Ambulatory Visit: Payer: Self-pay

## 2021-05-11 DIAGNOSIS — Z1231 Encounter for screening mammogram for malignant neoplasm of breast: Secondary | ICD-10-CM | POA: Diagnosis not present

## 2021-05-17 ENCOUNTER — Other Ambulatory Visit: Payer: Self-pay | Admitting: Family Medicine

## 2021-05-17 DIAGNOSIS — N63 Unspecified lump in unspecified breast: Secondary | ICD-10-CM

## 2021-05-17 DIAGNOSIS — R928 Other abnormal and inconclusive findings on diagnostic imaging of breast: Secondary | ICD-10-CM

## 2021-06-03 ENCOUNTER — Ambulatory Visit
Admission: RE | Admit: 2021-06-03 | Discharge: 2021-06-03 | Disposition: A | Discharge status: Home / Self Care | Payer: Federal, State, Local not specified - PPO | Source: Ambulatory Visit | Attending: Family Medicine | Admitting: Family Medicine

## 2021-06-03 ENCOUNTER — Other Ambulatory Visit: Payer: Self-pay

## 2021-06-03 DIAGNOSIS — R928 Other abnormal and inconclusive findings on diagnostic imaging of breast: Secondary | ICD-10-CM | POA: Diagnosis present

## 2021-06-03 DIAGNOSIS — N63 Unspecified lump in unspecified breast: Secondary | ICD-10-CM

## 2021-06-08 IMAGING — CT CT ABD-PEL WO/W CM
3 of 12 series · 13 of 46 positions shown, 19 images · IV contrast (omnipaque)
Comparison: None.

CLINICAL DATA: Gross hematuria, dysuria, right lower quadrant pain,
no history of kidney stones

EXAM:
CT ABDOMEN AND PELVIS WITHOUT AND WITH CONTRAST
TECHNIQUE: Multidetector CT imaging of the abdomen and pelvis was performed
following the standard protocol before and following the bolus
administration of intravenous contrast.
CONTRAST:  125mL OMNIPAQUE IOHEXOL 300 MG/ML  SOLN

[Series 2: abd without pre 5.00 · axial · non-contrast · 0.81mm/px · z∈[-1432,-1242]mm · 4 of 90 slices shown]
[im 13/90  soft-tissue]
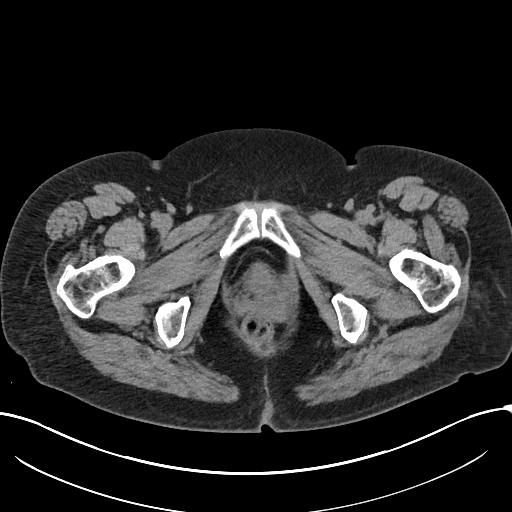
[im 26/90  soft-tissue]
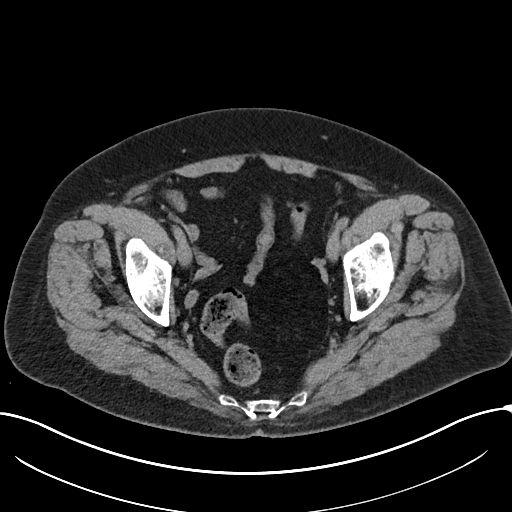
[im 39/90  soft-tissue]
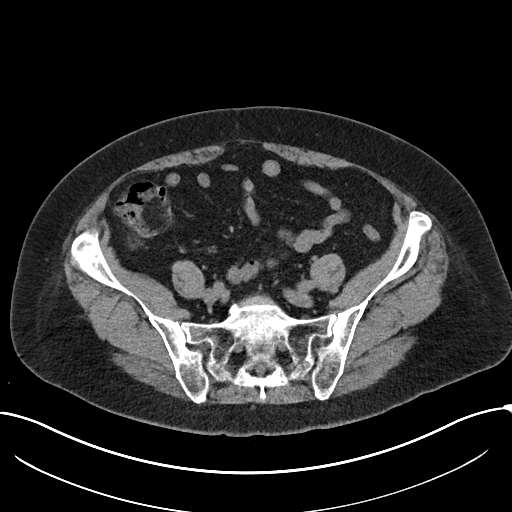
[im 51/90  soft-tissue]
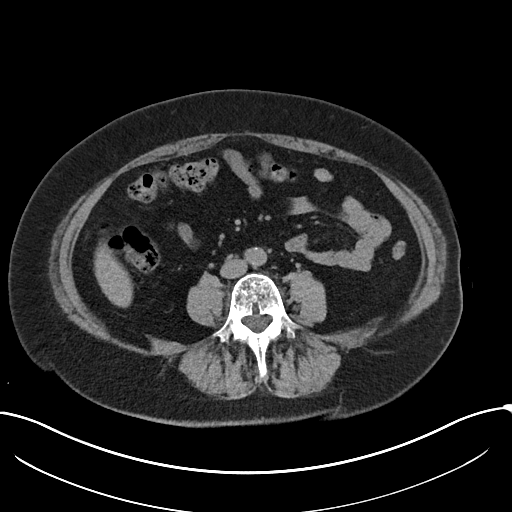

[Series 12: cor with hematuria with 2.00 cor · coronal · 0.81mm/px · 2 of 207 slices shown, 3 images]
[im 69/207  soft-tissue]
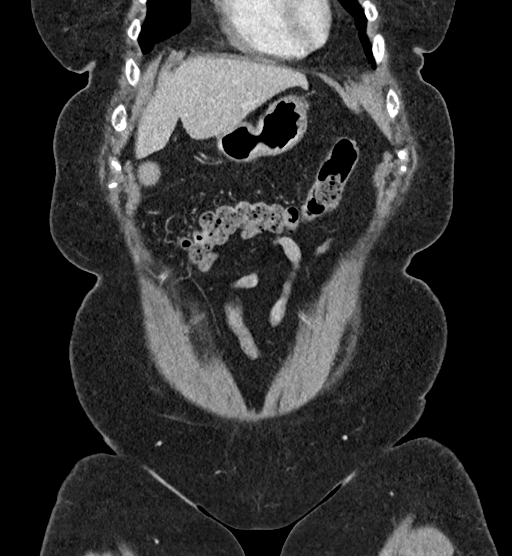
[im 69/207  bone]
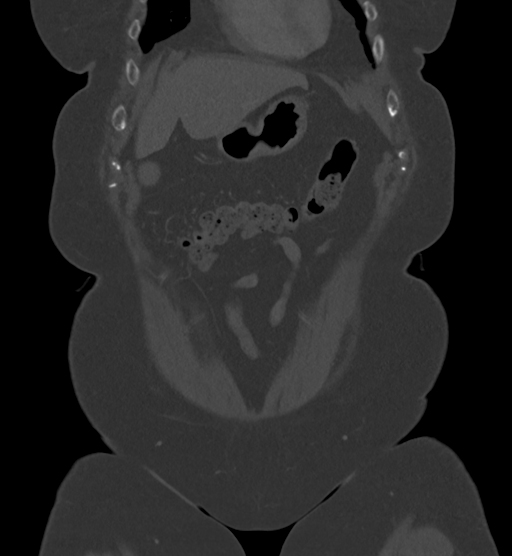
[im 138/207  soft-tissue]
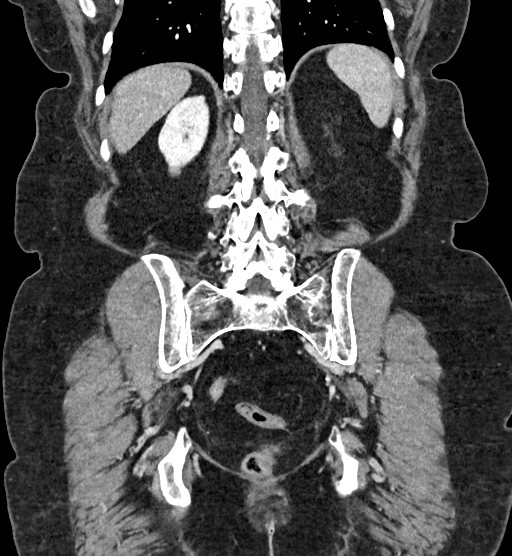

[Series 17: axial delay delay prone 5.00 · axial · delayed · 0.75mm/px · z∈[-1444,-1074]mm · 7 of 100 slices shown, 12 images]
[im 13/100  soft-tissue]
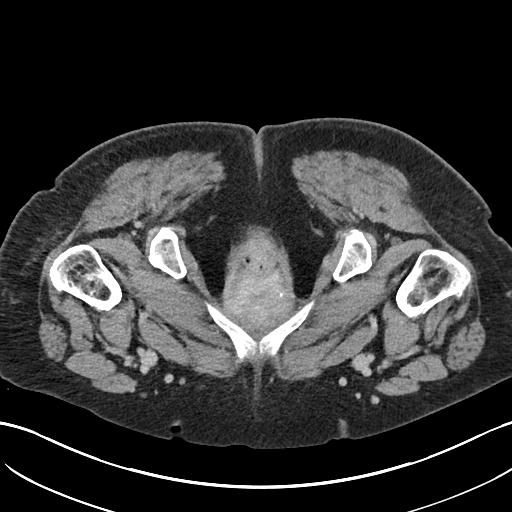
[im 13/100  bone]
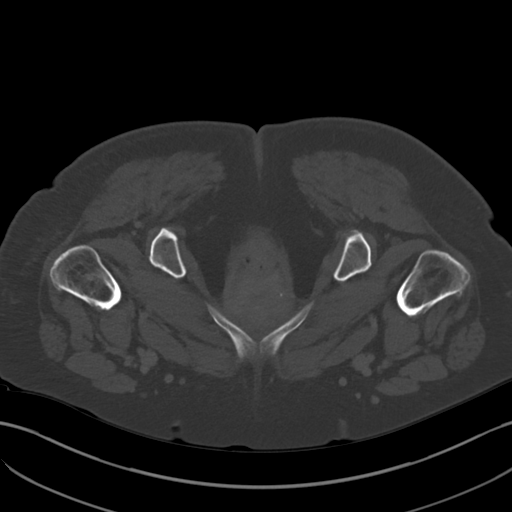
[im 25/100  soft-tissue]
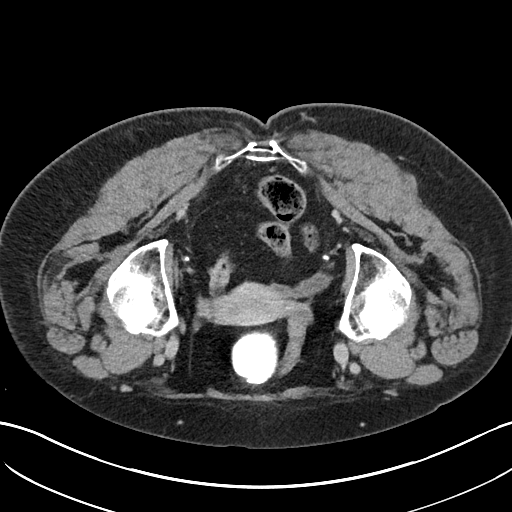
[im 38/100  soft-tissue]
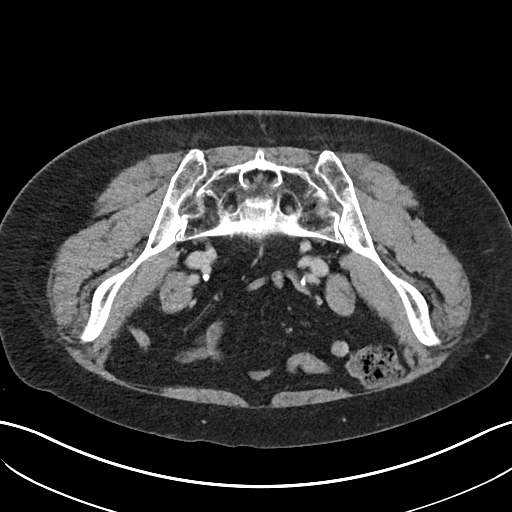
[im 50/100  soft-tissue]
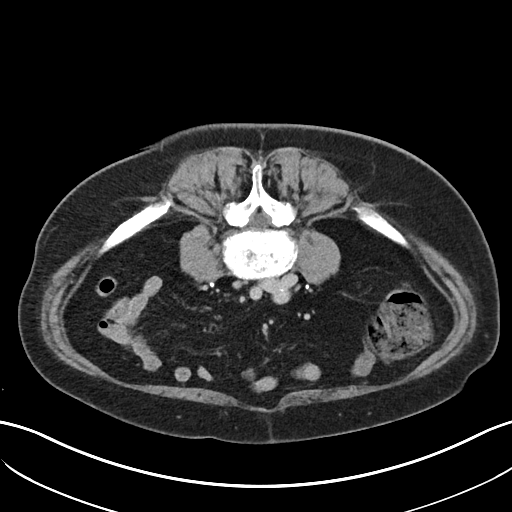
[im 50/100  lung]
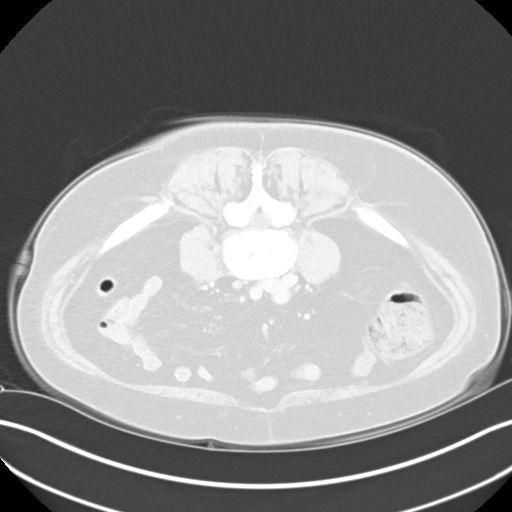
[im 62/100  soft-tissue]
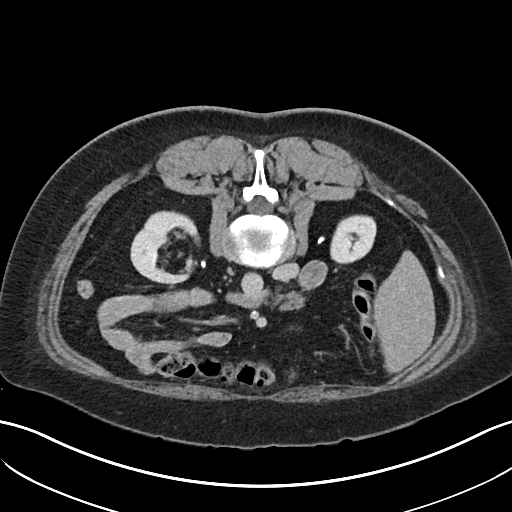
[im 62/100  lung]
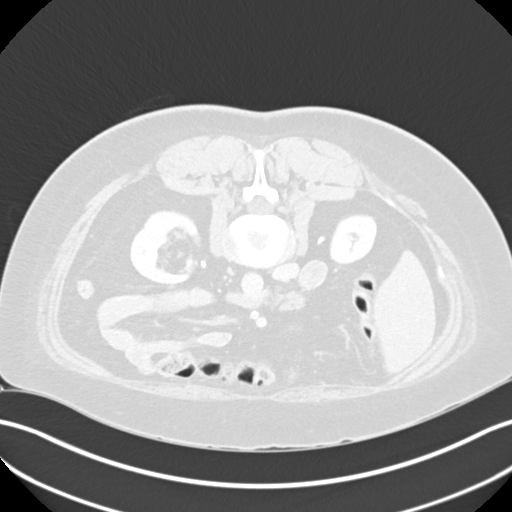
[im 75/100  soft-tissue]
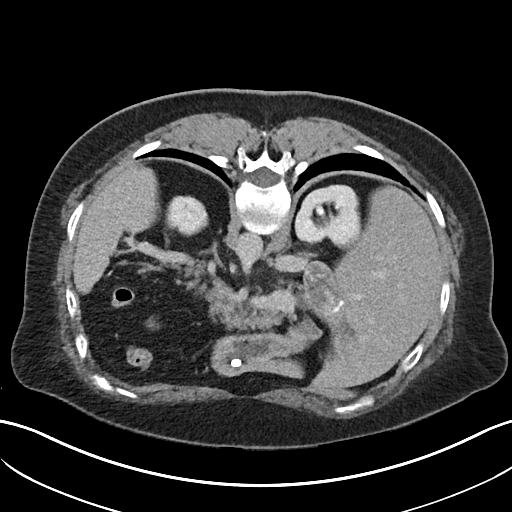
[im 75/100  lung]
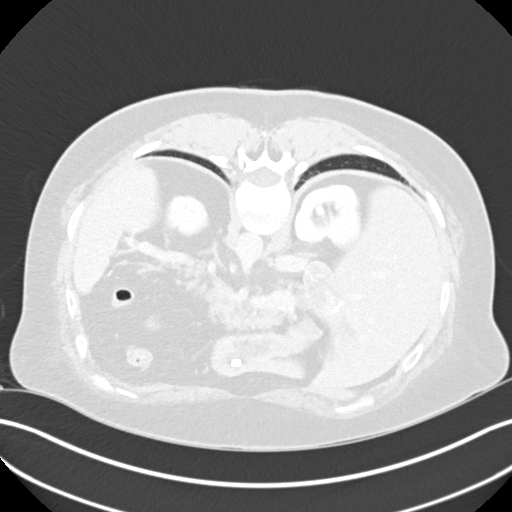
[im 87/100  soft-tissue]
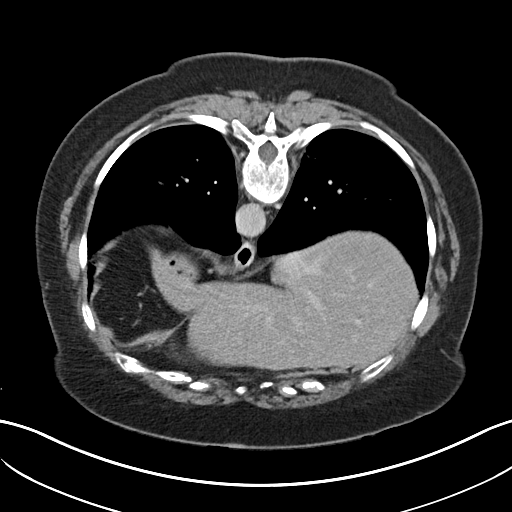
[im 87/100  lung]
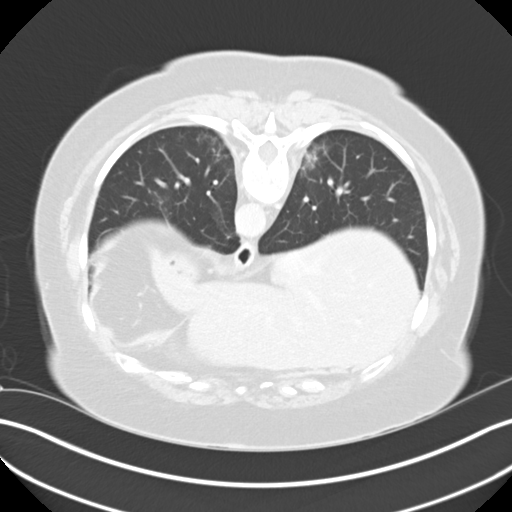

[13 of 46 positions shown; findings below may reference images not displayed]

FINDINGS: Lower chest: No acute abnormality.  Dependent bibasilar scarring.

Hepatobiliary: No solid liver abnormality is seen. Large gallstones
and calcific sludge in the gallbladder. No biliary ductal
dilatation.

Pancreas: Unremarkable. No pancreatic ductal dilatation or
surrounding inflammatory changes.

Spleen: Normal in size without significant abnormality.

Adrenals/Urinary Tract: Adrenal glands are unremarkable. Atrophic
appearing renal cortices. Nonenhancing simple cyst of the midportion
of the right kidney. Bladder is unremarkable.

Stomach/Bowel: Stomach is within normal limits. Appendix is
surgically absent. No evidence of bowel wall thickening, distention,
or inflammatory changes. Occasional sigmoid diverticula.

Vascular/Lymphatic: No significant vascular findings are present. No
enlarged abdominal or pelvic lymph nodes.

Reproductive: No mass or other significant abnormality.

Other: No abdominal wall hernia or abnormality. No abdominopelvic
ascites.

Musculoskeletal: No acute or significant osseous findings.
IMPRESSION: 1. No evidence of urinary tract calculus or filling defect of the
urinary tract to explain hematuria. No hydronephrosis.

2. Atrophic appearing renal cortices, as can be seen in medical
renal disease.

3.  Cholelithiasis.

## 2021-10-26 ENCOUNTER — Ambulatory Visit: Payer: Medicare Other | Admitting: Podiatry

## 2021-11-02 ENCOUNTER — Ambulatory Visit: Payer: Medicare Other | Admitting: Podiatry

## 2021-11-02 DIAGNOSIS — M778 Other enthesopathies, not elsewhere classified: Secondary | ICD-10-CM

## 2021-11-08 ENCOUNTER — Ambulatory Visit: Payer: Medicare Other | Admitting: Podiatry

## 2021-11-30 ENCOUNTER — Ambulatory Visit: Payer: Medicare Other | Admitting: Podiatry

## 2022-04-26 ENCOUNTER — Other Ambulatory Visit: Payer: Self-pay | Admitting: Family Medicine

## 2022-04-26 DIAGNOSIS — Z1231 Encounter for screening mammogram for malignant neoplasm of breast: Secondary | ICD-10-CM

## 2022-05-15 ENCOUNTER — Ambulatory Visit
Admission: RE | Admit: 2022-05-15 | Discharge: 2022-05-15 | Disposition: A | Discharge status: Home / Self Care | Payer: Federal, State, Local not specified - PPO | Source: Ambulatory Visit | Attending: Family Medicine | Admitting: Family Medicine

## 2022-05-15 DIAGNOSIS — Z1231 Encounter for screening mammogram for malignant neoplasm of breast: Secondary | ICD-10-CM | POA: Insufficient documentation

## 2023-04-12 ENCOUNTER — Other Ambulatory Visit: Payer: Self-pay | Admitting: Family Medicine

## 2023-04-12 DIAGNOSIS — Z1231 Encounter for screening mammogram for malignant neoplasm of breast: Secondary | ICD-10-CM

## 2023-05-16 ENCOUNTER — Ambulatory Visit
Admission: RE | Admit: 2023-05-16 | Discharge: 2023-05-16 | Disposition: A | Discharge status: Home / Self Care | Payer: Federal, State, Local not specified - PPO | Source: Ambulatory Visit | Attending: Family Medicine | Admitting: Family Medicine

## 2023-05-16 DIAGNOSIS — Z1231 Encounter for screening mammogram for malignant neoplasm of breast: Secondary | ICD-10-CM | POA: Insufficient documentation

## 2023-05-24 ENCOUNTER — Other Ambulatory Visit: Payer: Self-pay | Admitting: Family Medicine

## 2023-05-24 DIAGNOSIS — R928 Other abnormal and inconclusive findings on diagnostic imaging of breast: Secondary | ICD-10-CM

## 2023-05-28 ENCOUNTER — Ambulatory Visit
Admission: RE | Admit: 2023-05-28 | Discharge: 2023-05-28 | Disposition: A | Discharge status: Home / Self Care | Source: Ambulatory Visit | Attending: Family Medicine | Admitting: Family Medicine

## 2023-05-28 DIAGNOSIS — R928 Other abnormal and inconclusive findings on diagnostic imaging of breast: Secondary | ICD-10-CM | POA: Diagnosis present

## 2024-04-16 ENCOUNTER — Other Ambulatory Visit: Payer: Self-pay | Admitting: Family Medicine

## 2024-04-16 DIAGNOSIS — Z1231 Encounter for screening mammogram for malignant neoplasm of breast: Secondary | ICD-10-CM

## 2024-05-16 ENCOUNTER — Encounter
